# Patient Record
Sex: Female | Born: 1950 | Race: White | Hispanic: No | Smoking: Never smoker
Health system: Southern US, Community
[De-identification: ages and names within clinical notes are randomized; demographics above are authoritative.]

## PROBLEM LIST (undated history)

## (undated) DIAGNOSIS — M1990 Unspecified osteoarthritis, unspecified site: Secondary | ICD-10-CM

## (undated) DIAGNOSIS — G8929 Other chronic pain: Secondary | ICD-10-CM

## (undated) DIAGNOSIS — R51 Headache: Secondary | ICD-10-CM

## (undated) DIAGNOSIS — R05 Cough: Secondary | ICD-10-CM

## (undated) DIAGNOSIS — I10 Essential (primary) hypertension: Secondary | ICD-10-CM

## (undated) DIAGNOSIS — I340 Nonrheumatic mitral (valve) insufficiency: Secondary | ICD-10-CM

## (undated) DIAGNOSIS — T8859XA Other complications of anesthesia, initial encounter: Secondary | ICD-10-CM

## (undated) DIAGNOSIS — M797 Fibromyalgia: Secondary | ICD-10-CM

## (undated) DIAGNOSIS — I429 Cardiomyopathy, unspecified: Secondary | ICD-10-CM

## (undated) DIAGNOSIS — R059 Cough, unspecified: Secondary | ICD-10-CM

## (undated) DIAGNOSIS — IMO0002 Reserved for concepts with insufficient information to code with codable children: Secondary | ICD-10-CM

## (undated) DIAGNOSIS — E507 Other ocular manifestations of vitamin A deficiency: Secondary | ICD-10-CM

## (undated) DIAGNOSIS — I498 Other specified cardiac arrhythmias: Secondary | ICD-10-CM

## (undated) DIAGNOSIS — Z95810 Presence of automatic (implantable) cardiac defibrillator: Secondary | ICD-10-CM

## (undated) DIAGNOSIS — Z950 Presence of cardiac pacemaker: Secondary | ICD-10-CM

## (undated) DIAGNOSIS — R011 Cardiac murmur, unspecified: Secondary | ICD-10-CM

## (undated) DIAGNOSIS — M549 Dorsalgia, unspecified: Secondary | ICD-10-CM

## (undated) DIAGNOSIS — Z8719 Personal history of other diseases of the digestive system: Secondary | ICD-10-CM

## (undated) DIAGNOSIS — T4145XA Adverse effect of unspecified anesthetic, initial encounter: Secondary | ICD-10-CM

## (undated) DIAGNOSIS — I509 Heart failure, unspecified: Secondary | ICD-10-CM

---
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
## 1991-04-02 HISTORY — PX: DILATION AND CURETTAGE OF UTERUS: SHX78

## 1994-04-01 HISTORY — PX: POSTERIOR CERVICAL LAMINECTOMY: SHX2248

## 1998-04-01 HISTORY — PX: ANTERIOR CERVICAL DECOMP/DISCECTOMY FUSION: SHX1161

## 2008-04-01 HISTORY — PX: CARDIAC DEFIBRILLATOR PLACEMENT: SHX171

## 2008-07-30 HISTORY — PX: CARDIAC CATHETERIZATION: SHX172

## 2012-09-17 ENCOUNTER — Other Ambulatory Visit: Payer: Self-pay | Admitting: Orthopedic Surgery

## 2012-09-17 ENCOUNTER — Encounter (HOSPITAL_COMMUNITY): Payer: Self-pay | Admitting: Respiratory Therapy

## 2012-09-21 ENCOUNTER — Encounter (HOSPITAL_COMMUNITY)
Admission: RE | Admit: 2012-09-21 | Discharge: 2012-09-21 | Disposition: A | Discharge status: Home / Self Care | Payer: BC Managed Care – PPO | Source: Ambulatory Visit | Attending: Orthopedic Surgery | Admitting: Orthopedic Surgery

## 2012-09-21 ENCOUNTER — Encounter (HOSPITAL_COMMUNITY)
Admission: RE | Admit: 2012-09-21 | Discharge: 2012-09-21 | Disposition: A | Discharge status: Home / Self Care | Payer: BC Managed Care – PPO | Source: Ambulatory Visit | Attending: Anesthesiology | Admitting: Anesthesiology

## 2012-09-21 ENCOUNTER — Encounter (HOSPITAL_COMMUNITY): Payer: Self-pay

## 2012-09-21 HISTORY — DX: Cardiomyopathy, unspecified: I42.9

## 2012-09-21 HISTORY — DX: Unspecified osteoarthritis, unspecified site: M19.90

## 2012-09-21 HISTORY — DX: Presence of automatic (implantable) cardiac defibrillator: Z95.810

## 2012-09-21 HISTORY — DX: Headache: R51

## 2012-09-21 HISTORY — DX: Presence of cardiac pacemaker: Z95.0

## 2012-09-21 HISTORY — DX: Heart failure, unspecified: I50.9

## 2012-09-21 HISTORY — DX: Fibromyalgia: M79.7

## 2012-09-21 HISTORY — DX: Cough, unspecified: R05.9

## 2012-09-21 HISTORY — DX: Nonrheumatic mitral (valve) insufficiency: I34.0

## 2012-09-21 HISTORY — DX: Cardiac murmur, unspecified: R01.1

## 2012-09-21 HISTORY — DX: Cough: R05

## 2012-09-21 HISTORY — DX: Other ocular manifestations of vitamin A deficiency: E50.7

## 2012-09-21 LAB — CBC
Hemoglobin: 14.1 g/dL (ref 12.0–15.0)
MCH: 31.4 pg (ref 26.0–34.0)
MCHC: 33.9 g/dL (ref 30.0–36.0)
MCV: 92.7 fL (ref 78.0–100.0)
RBC: 4.49 MIL/uL (ref 3.87–5.11)

## 2012-09-21 LAB — BASIC METABOLIC PANEL
BUN: 13 mg/dL (ref 6–23)
CO2: 31 mEq/L (ref 19–32)
Calcium: 9.5 mg/dL (ref 8.4–10.5)
GFR calc non Af Amer: 74 mL/min — ABNORMAL LOW (ref >90)
Glucose, Bld: 94 mg/dL (ref 70–99)

## 2012-09-21 MED ORDER — CLINDAMYCIN PHOSPHATE 900 MG/50ML IV SOLN
900.0000 mg | INTRAVENOUS | Status: AC
  Administered 2012-09-22: 900 mg via INTRAVENOUS
  Filled 2012-09-21: qty 50

## 2012-09-21 NOTE — Progress Notes (Signed)
Pt denies SOB and chest pain. Pt states that Dr. Malissa Hippo of [facility] is her cardiologist. EKG, chest x ray and cardiac studies ( stress, echo, and cardiac cath records)  were requested. Also Peri-op prescription for ICD was sent to [facility] attn: Dr. Lorrin Mais . Per Dr. Morley Kos ( anesthesia) request, Kerry Fort of [employer] was notified to turn off ICD in holding area for pt shoulder procedure. Dr. Noreene Larsson (anesthesia) made aware that Ar[facility] John stated that " a magnet can be used and the PACU has a Engineer, materials for post-surgery interrogation." Dr. Noreene Larsson made aware and will follow up with Kerry Fort.

## 2012-09-21 NOTE — Pre-Procedure Instructions (Addendum)
VERSIA MIGNOGNA  09/21/2012   Your procedure is scheduled on: Tuesday, September 22, 2012  Report to [facility] at 10:30 AM.  Call this number if you have problems the morning of surgery: [phone]   Remember:   Do not eat food or drink liquids after midnight.   Take these medicines the morning of surgery with A SIP OF WATER:  nebivolol (BYSTOLIC) 2.5 MG tablet,  traMADol (ULTRAM-ER) 100 MG 24 hr tablet,  fluticasone (FLONASE) 50 MCG/ACT nasal spray, if needed: hydroxypropyl methylcellulose (ISOPTO TEARS) 2.5 % ophthalmic solution Stop taking Aspirin and herbal medications. Do not take any NSAIDs ie: Ibuprofen, Advil, Naproxen or any medication containing Aspirin.  Do not wear jewelry, make-up or nail polish.  Do not wear lotions, powders, or perfumes. You may wear deodorant.  Do not shave 48 hours prior to surgery. Men may shave face and neck.  Do not bring valuables to the hospital.  [facility] is not responsible for any belongings or valuables.  Contacts, dentures or bridgework may not be worn into surgery.  Leave suitcase in the car. After surgery it may be brought to your room.  For patients admitted to the hospital, checkout time is 11:00 AM the day of discharge.   Patients discharged the day of surgery will not be allowed to drive home.  Name and phone number of your driver:   Special Instructions: Shower using CHG 2 nights before surgery and the night before surgery.  If you shower the day of surgery use CHG.  Use special wash - you have one bottle of CHG for all showers.  You should use approximately 1/3 of the bottle for each shower.   Please read over the following fact sheets that you were given: Pain Booklet, Coughing and Deep Breathing, MRSA Information and Surgical Site Infection Prevention

## 2012-09-22 ENCOUNTER — Ambulatory Visit (HOSPITAL_COMMUNITY): Payer: BC Managed Care – PPO | Admitting: Anesthesiology

## 2012-09-22 ENCOUNTER — Encounter (HOSPITAL_COMMUNITY): Payer: Self-pay | Admitting: Surgery

## 2012-09-22 ENCOUNTER — Encounter (HOSPITAL_COMMUNITY): Payer: Self-pay | Admitting: Anesthesiology

## 2012-09-22 ENCOUNTER — Encounter (HOSPITAL_COMMUNITY)
Admission: RE | Disposition: A | Discharge status: Home / Self Care | Payer: Self-pay | Source: Ambulatory Visit | Attending: Orthopedic Surgery

## 2012-09-22 ENCOUNTER — Observation Stay (HOSPITAL_COMMUNITY)
Admission: RE | Admit: 2012-09-22 | Discharge: 2012-09-23 | Disposition: A | Discharge status: Home / Self Care | Payer: BC Managed Care – PPO | Source: Ambulatory Visit | Attending: Orthopedic Surgery | Admitting: Orthopedic Surgery

## 2012-09-22 DIAGNOSIS — M719 Bursopathy, unspecified: Secondary | ICD-10-CM | POA: Insufficient documentation

## 2012-09-22 DIAGNOSIS — Z95810 Presence of automatic (implantable) cardiac defibrillator: Secondary | ICD-10-CM | POA: Insufficient documentation

## 2012-09-22 DIAGNOSIS — M67919 Unspecified disorder of synovium and tendon, unspecified shoulder: Secondary | ICD-10-CM | POA: Insufficient documentation

## 2012-09-22 DIAGNOSIS — I428 Other cardiomyopathies: Secondary | ICD-10-CM | POA: Insufficient documentation

## 2012-09-22 DIAGNOSIS — I059 Rheumatic mitral valve disease, unspecified: Secondary | ICD-10-CM | POA: Insufficient documentation

## 2012-09-22 DIAGNOSIS — M7500 Adhesive capsulitis of unspecified shoulder: Principal | ICD-10-CM | POA: Insufficient documentation

## 2012-09-22 DIAGNOSIS — I509 Heart failure, unspecified: Secondary | ICD-10-CM | POA: Insufficient documentation

## 2012-09-22 DIAGNOSIS — M7502 Adhesive capsulitis of left shoulder: Secondary | ICD-10-CM

## 2012-09-22 HISTORY — DX: Other chronic pain: G89.29

## 2012-09-22 HISTORY — PX: SHOULDER ARTHROSCOPY WITH DEBRIDEMENT AND BICEP TENDON REPAIR: SHX5690

## 2012-09-22 HISTORY — DX: Personal history of other diseases of the digestive system: Z87.19

## 2012-09-22 HISTORY — DX: Dorsalgia, unspecified: M54.9

## 2012-09-22 HISTORY — DX: Reserved for concepts with insufficient information to code with codable children: IMO0002

## 2012-09-22 HISTORY — DX: Unspecified osteoarthritis, unspecified site: M19.90

## 2012-09-22 HISTORY — DX: Essential (primary) hypertension: I10

## 2012-09-22 SURGERY — SHOULDER ARTHROSCOPY WITH DEBRIDEMENT AND BICEP TENDON REPAIR
Anesthesia: General | Site: Shoulder | Laterality: Left | Wound class: Clean

## 2012-09-22 MED ORDER — CHLORHEXIDINE GLUCONATE 4 % EX LIQD: 60.0000 mL | Freq: Once | CUTANEOUS | Status: DC

## 2012-09-22 MED ORDER — MIDAZOLAM HCL 2 MG/2ML IJ SOLN: 1.0000 mg | INTRAMUSCULAR | Status: DC | PRN

## 2012-09-22 MED ORDER — LEVOCARNITINE 1 GM/10ML PO SOLN
500.0000 mg | Freq: Every day | ORAL | Status: DC
  Administered 2012-09-22: 500 mg via ORAL
  Filled 2012-09-22 (×2): qty 5

## 2012-09-22 MED ORDER — PHENOL 1.4 % MT LIQD: 1.0000 | OROMUCOSAL | Status: DC | PRN

## 2012-09-22 MED ORDER — NEOSTIGMINE METHYLSULFATE 1 MG/ML IJ SOLN
INTRAMUSCULAR | Status: DC | PRN
  Administered 2012-09-22: 2 mg via INTRAVENOUS

## 2012-09-22 MED ORDER — PROPOFOL 10 MG/ML IV BOLUS
INTRAVENOUS | Status: DC | PRN
  Administered 2012-09-22: 100 mg via INTRAVENOUS
  Administered 2012-09-22: 60 mg via INTRAVENOUS

## 2012-09-22 MED ORDER — SODIUM CHLORIDE 0.9 % IR SOLN
Status: DC | PRN
  Administered 2012-09-22 (×2): 3000 mL

## 2012-09-22 MED ORDER — MAGNESIUM OXIDE 400 MG PO TABS: 400.0000 mg | ORAL_TABLET | Freq: Every day | ORAL | Status: DC

## 2012-09-22 MED ORDER — EPINEPHRINE HCL 1 MG/ML IJ SOLN
INTRAMUSCULAR | Status: AC
  Filled 2012-09-22: qty 1

## 2012-09-22 MED ORDER — ACETAMINOPHEN 325 MG PO TABS: 650.0000 mg | ORAL_TABLET | Freq: Four times a day (QID) | ORAL | Status: DC | PRN

## 2012-09-22 MED ORDER — FENTANYL CITRATE 0.05 MG/ML IJ SOLN: 50.0000 ug | INTRAMUSCULAR | Status: DC | PRN

## 2012-09-22 MED ORDER — KETOROLAC TROMETHAMINE 15 MG/ML IJ SOLN
15.0000 mg | Freq: Once | INTRAMUSCULAR | Status: DC
  Filled 2012-09-22: qty 1

## 2012-09-22 MED ORDER — LIDOCAINE HCL (CARDIAC) 20 MG/ML IV SOLN
INTRAVENOUS | Status: DC | PRN
  Administered 2012-09-22: 80 mg via INTRAVENOUS

## 2012-09-22 MED ORDER — LEVOCARNITINE 500 MG PO TABS
500.0000 mg | ORAL_TABLET | Freq: Every day | ORAL | Status: DC
Start: 2012-09-22 — End: 2012-09-22

## 2012-09-22 MED ORDER — NEBIVOLOL HCL 2.5 MG PO TABS
2.5000 mg | ORAL_TABLET | Freq: Every day | ORAL | Status: DC
  Administered 2012-09-22 – 2012-09-23 (×2): 2.5 mg via ORAL
  Filled 2012-09-22 (×2): qty 1

## 2012-09-22 MED ORDER — LACTATED RINGERS IV SOLN
INTRAVENOUS | Status: DC | PRN
  Administered 2012-09-22: 12:00:00 via INTRAVENOUS

## 2012-09-22 MED ORDER — METOCLOPRAMIDE HCL 5 MG PO TABS: 5.0000 mg | ORAL_TABLET | Freq: Three times a day (TID) | ORAL | Status: DC | PRN

## 2012-09-22 MED ORDER — METOCLOPRAMIDE HCL 5 MG/ML IJ SOLN: 5.0000 mg | Freq: Three times a day (TID) | INTRAMUSCULAR | Status: DC | PRN

## 2012-09-22 MED ORDER — HYDROMORPHONE HCL PF 1 MG/ML IJ SOLN: 0.5000 mg | INTRAMUSCULAR | Status: DC | PRN

## 2012-09-22 MED ORDER — METOPROLOL TARTRATE 1 MG/ML IV SOLN
INTRAVENOUS | Status: DC | PRN
  Administered 2012-09-22: .25 mg via INTRAVENOUS

## 2012-09-22 MED ORDER — ROCURONIUM BROMIDE 100 MG/10ML IV SOLN
INTRAVENOUS | Status: DC | PRN
  Administered 2012-09-22: 35 mg via INTRAVENOUS

## 2012-09-22 MED ORDER — EPHEDRINE SULFATE 50 MG/ML IJ SOLN
INTRAMUSCULAR | Status: DC | PRN
  Administered 2012-09-22: 10 mg via INTRAVENOUS
  Administered 2012-09-22: 5 mg via INTRAVENOUS

## 2012-09-22 MED ORDER — SODIUM CHLORIDE 0.9 % IJ SOLN
INTRAMUSCULAR | Status: DC | PRN
  Administered 2012-09-22: 50 mL

## 2012-09-22 MED ORDER — MENTHOL 3 MG MT LOZG: 1.0000 | LOZENGE | OROMUCOSAL | Status: DC | PRN

## 2012-09-22 MED ORDER — SPIRONOLACTONE 12.5 MG HALF TABLET
12.5000 mg | ORAL_TABLET | Freq: Every day | ORAL | Status: DC
Start: 2012-09-22 — End: 2012-09-23
  Administered 2012-09-22 – 2012-09-23 (×2): 12.5 mg via ORAL
  Filled 2012-09-22 (×2): qty 1

## 2012-09-22 MED ORDER — KETOROLAC TROMETHAMINE 15 MG/ML IJ SOLN
15.0000 mg | Freq: Four times a day (QID) | INTRAMUSCULAR | Status: DC
  Administered 2012-09-22 – 2012-09-23 (×3): 15 mg via INTRAVENOUS
  Filled 2012-09-22 (×6): qty 1

## 2012-09-22 MED ORDER — ASPIRIN 325 MG PO TABS
325.0000 mg | ORAL_TABLET | Freq: Every day | ORAL | Status: DC
  Filled 2012-09-22: qty 1

## 2012-09-22 MED ORDER — EPINEPHRINE HCL 1 MG/ML IJ SOLN
INTRAMUSCULAR | Status: DC | PRN
  Administered 2012-09-22: 1 mg

## 2012-09-22 MED ORDER — RAMIPRIL 1.25 MG PO CAPS
1.2500 mg | ORAL_CAPSULE | Freq: Every day | ORAL | Status: DC
  Administered 2012-09-22: 1.25 mg via ORAL
  Filled 2012-09-22 (×3): qty 1

## 2012-09-22 MED ORDER — MIDAZOLAM HCL 2 MG/2ML IJ SOLN
INTRAMUSCULAR | Status: AC
  Administered 2012-09-22: 1 mg via INTRAVENOUS
  Filled 2012-09-22: qty 2

## 2012-09-22 MED ORDER — GLYCOPYRROLATE 0.2 MG/ML IJ SOLN
INTRAMUSCULAR | Status: DC | PRN
  Administered 2012-09-22: 0.4 mg via INTRAVENOUS

## 2012-09-22 MED ORDER — HYPROMELLOSE (GONIOSCOPIC) 2.5 % OP SOLN
1.0000 [drp] | Freq: Four times a day (QID) | OPHTHALMIC | Status: DC | PRN
  Filled 2012-09-22: qty 15

## 2012-09-22 MED ORDER — ONDANSETRON HCL 4 MG PO TABS: 4.0000 mg | ORAL_TABLET | Freq: Four times a day (QID) | ORAL | Status: DC | PRN

## 2012-09-22 MED ORDER — FENTANYL CITRATE 0.05 MG/ML IJ SOLN
INTRAMUSCULAR | Status: AC
  Administered 2012-09-22: 50 ug via INTRAVENOUS
  Filled 2012-09-22: qty 2

## 2012-09-22 MED ORDER — FLUTICASONE PROPIONATE 50 MCG/ACT NA SUSP
2.0000 | Freq: Every day | NASAL | Status: DC
  Filled 2012-09-22: qty 16

## 2012-09-22 MED ORDER — CLINDAMYCIN PHOSPHATE 600 MG/50ML IV SOLN
600.0000 mg | Freq: Four times a day (QID) | INTRAVENOUS | Status: AC
  Administered 2012-09-22 – 2012-09-23 (×3): 600 mg via INTRAVENOUS
  Filled 2012-09-22 (×3): qty 50

## 2012-09-22 MED ORDER — TRAMADOL HCL 50 MG PO TABS
50.0000 mg | ORAL_TABLET | Freq: Four times a day (QID) | ORAL | Status: DC
  Filled 2012-09-22 (×4): qty 1

## 2012-09-22 MED ORDER — DIGOXIN 125 MCG PO TABS
0.1250 mg | ORAL_TABLET | Freq: Every day | ORAL | Status: DC
  Administered 2012-09-22: 0.125 mg via ORAL
  Filled 2012-09-22 (×3): qty 1

## 2012-09-22 MED ORDER — ACETAMINOPHEN 650 MG RE SUPP: 650.0000 mg | Freq: Four times a day (QID) | RECTAL | Status: DC | PRN

## 2012-09-22 MED ORDER — ONDANSETRON HCL 4 MG/2ML IJ SOLN: 4.0000 mg | Freq: Four times a day (QID) | INTRAMUSCULAR | Status: DC | PRN

## 2012-09-22 MED ORDER — MAGNESIUM OXIDE 400 (241.3 MG) MG PO TABS
400.0000 mg | ORAL_TABLET | Freq: Every day | ORAL | Status: DC
  Administered 2012-09-22 – 2012-09-23 (×2): 400 mg via ORAL
  Filled 2012-09-22 (×2): qty 1

## 2012-09-22 MED ORDER — ROPIVACAINE HCL 5 MG/ML IJ SOLN
INTRAMUSCULAR | Status: DC | PRN
  Administered 2012-09-22: 20 mL

## 2012-09-22 MED ORDER — KETOROLAC TROMETHAMINE 30 MG/ML IJ SOLN
INTRAMUSCULAR | Status: AC
  Administered 2012-09-22: 15 mg
  Filled 2012-09-22: qty 1

## 2012-09-22 MED ORDER — FUROSEMIDE 20 MG PO TABS: 20.0000 mg | ORAL_TABLET | Freq: Every day | ORAL | Status: DC | PRN

## 2012-09-22 MED ORDER — ARTIFICIAL TEARS OP OINT
TOPICAL_OINTMENT | OPHTHALMIC | Status: DC | PRN
  Administered 2012-09-22: 1 via OPHTHALMIC

## 2012-09-22 MED ORDER — POLYVINYL ALCOHOL 1.4 % OP SOLN
1.0000 [drp] | Freq: Four times a day (QID) | OPHTHALMIC | Status: DC | PRN
  Filled 2012-09-22 (×2): qty 15

## 2012-09-22 MED ORDER — ONDANSETRON HCL 4 MG/2ML IJ SOLN
INTRAMUSCULAR | Status: DC | PRN
  Administered 2012-09-22: 4 mg via INTRAVENOUS

## 2012-09-22 SURGICAL SUPPLY — 66 items
BENZOIN TINCTURE PRP APPL 2/3 (GAUZE/BANDAGES/DRESSINGS) IMPLANT
BIT DRILL TAK (DRILL) IMPLANT
BLADE CUDA 5.5 (BLADE) IMPLANT
BLADE CUTTER GATOR 3.5 (BLADE) IMPLANT
BLADE GREAT WHITE 4.2 (BLADE) ×2 IMPLANT
BLADE SURG 11 STRL SS (BLADE) ×2 IMPLANT
BUR GATOR 2.9 (BURR) IMPLANT
BUR OVAL 6.0 (BURR) IMPLANT
CANNULA SHOULDER 7CM (CANNULA) IMPLANT
CARTRIDGE CURVETEK MED (MISCELLANEOUS) IMPLANT
CARTRIDGE CURVETEK XLRG (MISCELLANEOUS) IMPLANT
CLOTH BEACON ORANGE TIMEOUT ST (SAFETY) ×2 IMPLANT
COVER SURGICAL LIGHT HANDLE (MISCELLANEOUS) ×2 IMPLANT
DRAPE INCISE IOBAN 66X45 STRL (DRAPES) ×4 IMPLANT
DRAPE STERI 35X30 U-POUCH (DRAPES) ×2 IMPLANT
DRAPE U-SHAPE 47X51 STRL (DRAPES) ×4 IMPLANT
DRILL TAK (DRILL)
DRSG PAD ABDOMINAL 8X10 ST (GAUZE/BANDAGES/DRESSINGS) ×2 IMPLANT
DURAPREP 26ML APPLICATOR (WOUND CARE) ×4 IMPLANT
ELECT MENISCUS 165MM 90D (ELECTRODE) IMPLANT
ELECT REM PT RETURN 9FT ADLT (ELECTROSURGICAL) ×2
ELECTRODE REM PT RTRN 9FT ADLT (ELECTROSURGICAL) ×1 IMPLANT
FILTER STRAW FLUID ASPIR (MISCELLANEOUS) ×2 IMPLANT
GAUZE XEROFORM 1X8 LF (GAUZE/BANDAGES/DRESSINGS) IMPLANT
GLOVE BIO SURGEON ST LM GN SZ9 (GLOVE) IMPLANT
GLOVE BIOGEL PI IND STRL 8 (GLOVE) ×1 IMPLANT
GLOVE BIOGEL PI INDICATOR 8 (GLOVE) ×1
GLOVE SURG ORTHO 8.0 STRL STRW (GLOVE) IMPLANT
GOWN PREVENTION PLUS LG XLONG (DISPOSABLE) IMPLANT
GOWN STRL NON-REIN LRG LVL3 (GOWN DISPOSABLE) ×6 IMPLANT
KIT BASIN OR (CUSTOM PROCEDURE TRAY) ×2 IMPLANT
KIT ROOM TURNOVER OR (KITS) ×2 IMPLANT
MANIFOLD NEPTUNE II (INSTRUMENTS) ×2 IMPLANT
NDL SUT 6 .5 CRC .975X.05 MAYO (NEEDLE) IMPLANT
NEEDLE HYPO 25X1 1.5 SAFETY (NEEDLE) ×2 IMPLANT
NEEDLE MAYO TAPER (NEEDLE)
NEEDLE SPNL 18GX3.5 QUINCKE PK (NEEDLE) ×2 IMPLANT
NS IRRIG 1000ML POUR BTL (IV SOLUTION) ×2 IMPLANT
PACK SHOULDER (CUSTOM PROCEDURE TRAY) ×2 IMPLANT
PAD ARMBOARD 7.5X6 YLW CONV (MISCELLANEOUS) ×4 IMPLANT
SET ARTHROSCOPY TUBING (MISCELLANEOUS) ×1
SET ARTHROSCOPY TUBING LN (MISCELLANEOUS) ×1 IMPLANT
SLING ARM IMMOBILIZER MED (SOFTGOODS) ×2 IMPLANT
SPEAR FASTAKII (SLEEVE) IMPLANT
SPONGE GAUZE 4X4 12PLY (GAUZE/BANDAGES/DRESSINGS) IMPLANT
SPONGE LAP 4X18 X RAY DECT (DISPOSABLE) ×4 IMPLANT
STRIP CLOSURE SKIN 1/2X4 (GAUZE/BANDAGES/DRESSINGS) IMPLANT
SUCTION FRAZIER TIP 10 FR DISP (SUCTIONS) ×2 IMPLANT
SUT ETHILON 3 0 PS 1 (SUTURE) ×4 IMPLANT
SUT FIBERWIRE 2-0 18 17.9 3/8 (SUTURE)
SUT PROLENE 3 0 PS 2 (SUTURE) IMPLANT
SUT VIC AB 0 CT1 27 (SUTURE)
SUT VIC AB 0 CT1 27XBRD ANBCTR (SUTURE) IMPLANT
SUT VIC AB 1 CT1 27 (SUTURE)
SUT VIC AB 1 CT1 27XBRD ANBCTR (SUTURE) IMPLANT
SUT VIC AB 2-0 CT1 27 (SUTURE)
SUT VIC AB 2-0 CT1 TAPERPNT 27 (SUTURE) IMPLANT
SUT VICRYL 0 UR6 27IN ABS (SUTURE) IMPLANT
SUTURE FIBERWR 2-0 18 17.9 3/8 (SUTURE) IMPLANT
SYR 20CC LL (SYRINGE) ×4 IMPLANT
SYR 3ML LL SCALE MARK (SYRINGE) ×2 IMPLANT
SYR TB 1ML LUER SLIP (SYRINGE) ×2 IMPLANT
TOWEL OR 17X24 6PK STRL BLUE (TOWEL DISPOSABLE) ×2 IMPLANT
TOWEL OR 17X26 10 PK STRL BLUE (TOWEL DISPOSABLE) ×2 IMPLANT
WAND 90 DEG TURBOVAC W/CORD (SURGICAL WAND) ×2 IMPLANT
WATER STERILE IRR 1000ML POUR (IV SOLUTION) ×2 IMPLANT

## 2012-09-22 NOTE — Transfer of Care (Signed)
Immediate Anesthesia Transfer of Care Note  Patient: Leah Gomez  Procedure(s) Performed: Procedure(s) with comments: SHOULDER ARTHROSCOPY WITH DEBRIDEMENT AND BICEP TENDON REPAIR (Left) - Arthroscopy Shoulder with manipulation under anesthesia  Patient Location: PACU  Anesthesia Type:General  Level of Consciousness: awake, alert  and oriented  Airway & Oxygen Therapy: Patient Spontanous Breathing and Patient connected to nasal cannula oxygen  Post-op Assessment: Report given to PACU RN  Post vital signs: Reviewed and stable  Complications: No apparent anesthesia complications

## 2012-09-22 NOTE — Progress Notes (Signed)
Pacemaker interrogated by [employer] employee Lamar Sprinkles.

## 2012-09-22 NOTE — Progress Notes (Signed)
Perioperative prescription for implanted cardiac device programming sheet refaxed to Dr. Lorrin Mais at [facility].

## 2012-09-22 NOTE — Anesthesia Postprocedure Evaluation (Signed)
  Anesthesia Post-op Note  Patient: Leah Gomez  Procedure(s) Performed: Procedure(s) with comments: SHOULDER ARTHROSCOPY WITH DEBRIDEMENT AND BICEP TENDON REPAIR (Left) - Arthroscopy Shoulder with manipulation under anesthesia  Patient Location: PACU  Anesthesia Type:GA combined with regional for post-op pain  Level of Consciousness: awake, alert  and oriented  Airway and Oxygen Therapy: Patient Spontanous Breathing and Patient connected to nasal cannula oxygen  Post-op Pain: none  Post-op Assessment: Post-op Vital signs reviewed, Patient's Cardiovascular Status Stable, Respiratory Function Stable, Patent Airway and No signs of Nausea or vomiting  Post-op Vital Signs: Reviewed and stable  Complications: No apparent anesthesia complications

## 2012-09-22 NOTE — Brief Op Note (Signed)
09/22/2012  2:39 PM  PATIENT:  Rich Fuchs  62 y.o. female  PRE-OPERATIVE DIAGNOSIS:  Left Shoulder Adhesive Capsulitis  POST-OPERATIVE DIAGNOSIS:  Left Shoulder Adhesive Capsulitis  PROCEDURE:  Procedure(s): SHOULDER ARTHROSCOPY WITH DEBRIDEMENT AND manipulation  SURGEON:  Surgeon(s): Cammy Copa, MD  ASSISTANT:   ANESTHESIA:   general  EBL: 10 ml       BLOOD ADMINISTERED: none  DRAINS: none   LOCAL MEDICATIONS USED:  none  SPECIMEN:  No Specimen  COUNTS:  YES  TOURNIQUET:  * No tourniquets in log *  DICTATION: .Other Dictation: Dictation Number [id number]  PLAN OF CARE: Admit for overnight observation  PATIENT DISPOSITION:  PACU - hemodynamically stable

## 2012-09-22 NOTE — Preoperative (Signed)
Beta Blockers   Reason not to administer Beta Blockers:Not Applicable, last dose 09/21/12 at 12:30pm

## 2012-09-22 NOTE — Anesthesia Preprocedure Evaluation (Signed)
Anesthesia Evaluation  Patient identified by MRN, date of birth, ID band Patient awake    Airway Mallampati: I      Dental  (+) Teeth Intact   Pulmonary  breath sounds clear to auscultation        Cardiovascular +CHF + pacemaker + Cardiac Defibrillator + Valvular Problems/Murmurs Rhythm:Regular Rate:Tachycardia     Neuro/Psych  Headaches,  Neuromuscular disease    GI/Hepatic   Endo/Other    Renal/GU      Musculoskeletal  (+) Arthritis -, Fibromyalgia -  Abdominal   Peds  Hematology   Anesthesia Other Findings   Reproductive/Obstetrics                           Anesthesia Physical Anesthesia Plan  ASA: III  Anesthesia Plan:    Post-op Pain Management:    Induction: Intravenous  Airway Management Planned: Oral ETT  Additional Equipment:   Intra-op Plan:   Post-operative Plan: Extubation in OR  Informed Consent: I have reviewed the patients History and Physical, chart, labs and discussed the procedure including the risks, benefits and alternatives for the proposed anesthesia with the patient or authorized representative who has indicated his/her understanding and acceptance.   Dental advisory given  Plan Discussed with: CRNA and Surgeon  Anesthesia Plan Comments: (Discussed plan c patient in detail , AICD, and plan for block fof shoulder, )        Anesthesia Quick Evaluation

## 2012-09-22 NOTE — H&P (Signed)
Leah Gomez is an 62 y.o. female.   Chief Complaint: Left shoulder pain and stiffness HPI: Leah Gomez is a 62 year old patient with left shoulder pain and stiffness of 6 months duration. Pain and stiffness began insidiously after minor trauma. She has had treatment to date involving physical therapy injection of which did not give her any pain relief. She reports pain with ADLs progressive stiffness which is worsening as well as a pain which he resting at night. Patient does have multiple other medical problems with significant which included a pacemaker for heart failure. Her scan showed ejection fraction of 25%. The patient does like to swim but she's not been able to swim in about 6 weeks am which was Sunday she going to a limited pressure because of his shoulder stiffness. She has had neck fusion as well but denies any radicular type symptoms.  Past Medical History  Diagnosis Date  . Cardiomyopathy     Hx: of nonischemic  . CHF (congestive heart failure)   . Automatic implantable cardioverter-defibrillator in situ   . Pacemaker   . DJD (degenerative joint disease)     Hx: of  . Xerophthalmia     Hx: of  . Chronic pain     Hx; of  . Mitral regurgitation     Hx: of  . Cough     Hx: of  . Heart murmur     Hx: of  . Headache(784.0)     Hx: of "from neck pain"  . Fibromyalgia     Past Surgical History  Procedure Laterality Date  . Laminectomy      Hx: of  . Cervical fusion      Hx: of  . Insert / replace / remove pacemaker    . Cardiac catheterization      Hx: of may, 2010  . Dilation and curettage of uterus      Family History  Problem Relation Age of Onset  . Hypertension Mother   . Hypothyroidism Mother    Social History:  reports that she has never smoked. She has never used smokeless tobacco. She reports that  drinks alcohol. She reports that she does not use illicit drugs.  Allergies:  Allergies  Allergen Reactions  . Codeine Other (See Comments)    GI  problems  . Diazepam Other (See Comments)    Bladder problems  . Levofloxacin   . Lisinopril Other (See Comments)    Cough and light headed  . Thimerosal Other (See Comments)    Redness   . Keflex (Cephalexin) Rash  . Latex Rash    Medications Prior to Admission  Medication Sig Dispense Refill  . BLACK CURRANT SEED OIL PO Take 1,000 mg by mouth daily.      . diclofenac (FLECTOR) 1.3 % PTCH Place 1 patch onto the skin 2 (two) times daily.      . digoxin (LANOXIN) 0.125 MG tablet Take 0.125 mg by mouth daily.      . fluticasone (FLONASE) 50 MCG/ACT nasal spray Place 2 sprays into the nose daily.      . furosemide (LASIX) 20 MG tablet Take 20 mg by mouth daily as needed (for swelling).      . hydroxypropyl methylcellulose (ISOPTO TEARS) 2.5 % ophthalmic solution Place 1 drop into both eyes 4 (four) times daily as needed.      Boris Lown Oil 300 MG CAPS Take 300 mg by mouth daily.      . Levocarnitine 500 MG TABS Take  500 mg by mouth daily.      . magnesium oxide (MAG-OX) 400 MG tablet Take 400 mg by mouth daily.      . nebivolol (BYSTOLIC) 2.5 MG tablet Take 2.5 mg by mouth daily.      . Nutritional Supplements (PYCNOGENOL PO) Take 50 mg by mouth daily.      Marland Kitchen OVER THE COUNTER MEDICATION Take 12 mg by mouth daily. BioAstin      . ramipril (ALTACE) 1.25 MG capsule Take 1.25 mg by mouth daily.      Marland Kitchen spironolactone (ALDACTONE) 25 MG tablet Take 12.5 mg by mouth daily.      . traMADol (ULTRAM-ER) 100 MG 24 hr tablet Take 100 mg by mouth daily.      . Turmeric 500 MG CAPS Take 500 mg by mouth daily.        Results for orders placed during the hospital encounter of 09/21/12 (from the past 48 hour(s))  SURGICAL PCR SCREEN     Status: None   Collection Time    09/21/12 11:49 AM      Result Value Range   MRSA, PCR NEGATIVE  NEGATIVE   Staphylococcus aureus NEGATIVE  NEGATIVE   Comment:            The Xpert SA Assay (FDA     approved for NASAL specimens     in patients over 21 years of  age),     is one component of     a comprehensive surveillance     program.  Test performance has     been validated by [employer] for patients greater     than or equal to 28 year old.     It is not intended     to diagnose infection nor to     guide or monitor treatment.  CBC     Status: None   Collection Time    09/21/12 11:50 AM      Result Value Range   WBC 10.0  4.0 - 10.5 K/uL   RBC 4.49  3.87 - 5.11 MIL/uL   Hemoglobin 14.1  12.0 - 15.0 g/dL   HCT 09.6  04.5 - 40.9 %   MCV 92.7  78.0 - 100.0 fL   MCH 31.4  26.0 - 34.0 pg   MCHC 33.9  30.0 - 36.0 g/dL   RDW 81.1  91.4 - 78.2 %   Platelets 184  150 - 400 K/uL  BASIC METABOLIC PANEL     Status: Abnormal   Collection Time    09/21/12 11:51 AM      Result Value Range   Sodium 137  135 - 145 mEq/L   Potassium 3.7  3.5 - 5.1 mEq/L   Chloride 98  96 - 112 mEq/L   CO2 31  19 - 32 mEq/L   Glucose, Bld 94  70 - 99 mg/dL   BUN 13  6 - 23 mg/dL   Creatinine, Ser 9.56  0.50 - 1.10 mg/dL   Calcium 9.5  8.4 - 21.3 mg/dL   GFR calc non Af Amer 74 (*) >90 mL/min   GFR calc Af Amer 85 (*) >90 mL/min   Comment:            The eGFR has been calculated     using the CKD EPI equation.     This calculation has not been     validated in all clinical  situations.     eGFR's persistently     <90 mL/min signify     possible Chronic Kidney Disease.   Dg Chest 2 View  09/21/2012   *RADIOLOGY REPORT*  Clinical Data: Shoulder arthroscopy with debridement and biceps tendon repair.  Cardiomyopathy and history of CHF, mitral regurgitation.  CHEST - 2 VIEW  Comparison: None.  Findings: The patient has left-sided AICD / transvenous pacemaker. Leads overlie the right atrium, right ventricle, and coronary sinus.  Heart size is normal.  The lungs are clear.  No edema. Visualized osseous structures have a normal appearance.  IMPRESSION: No evidence for acute cardiopulmonary abnormality.   Original Report Authenticated By: Norva Pavlov, M.D.     Review of Systems  Constitutional: Negative.   HENT: Negative.   Eyes: Negative.   Respiratory: Negative.   Cardiovascular: Negative.   Gastrointestinal: Negative.   Genitourinary: Negative.   Musculoskeletal: Positive for joint pain.  Skin: Negative.   Neurological: Negative.   Endo/Heme/Allergies: Negative.   Psychiatric/Behavioral: Negative.     Blood pressure 125/57, pulse 70, temperature 97.6 F (36.4 C), temperature source Oral, resp. rate 20, SpO2 100.00%. Physical Exam  Constitutional: She appears well-developed.  HENT:  Head: Normocephalic.  Eyes: Pupils are equal, round, and reactive to light.  Neck: Normal range of motion.  Cardiovascular: Normal rate.   Respiratory: Effort normal.  Neurological: She is alert.  Skin: Skin is warm.  Psychiatric: She has a normal mood and affect.   left shoulder demonstrates reasonable superior/May the subscap strength but restricted range of motion 20 extra rotation 15 minutes abduction only about 40 of isolated minimal abduction and about 60 of isolette glenohumeral for collection no coarse grinding or crepitus noted with passive range of motion left shoulder neck range of motion full motor sensory function of the hand is intact radial pulses intact she did have an ultrasound of her left shoulder in the office which showed possible partial thickness rotator cuff tearing cannot have an MRI scan. Chest is some pain radiating into the biceps  Assessment/Plan Impression is left frozen shoulder which is been progressive worsening over the past 6 months she's had excellent nonoperative therapy including injection and formal physical therapy. She's not made any gains in range of motion. I discussed with her at length in the office about the natural history of present shoulder. This may take up to 2 to appears to resolve. Patient is awake that long. She's having daily symptoms and nightly pain. Patient does have a history of osteopenia.  Plan this time is for shoulder manipulation under anesthesia with arthroscopic evaluation and debridement. Heart scopic evaluation is primarily of the biceps tendon. There is any SLAP lesion present biceps tenodesis will be performed. Further release of the rotator interval could also be performed. But favor arthroscopic evaluation of the shoulder while the patient is under a just because of the pacemaker and need to fully evaluate the intra-articular joint structures. Patient understands the risk and benefits of surgery including limited to progress her shoulder stiffness fracture and a persistent pain and prolonged recovery. Do plan to use CPM machine for least 2 weeks postop. Also plan to admit overnight. This is prerenal because of her pacemaker. All questions answered DEAN,GREGORY SCOTT 09/22/2012, 12:35 PM

## 2012-09-22 NOTE — Progress Notes (Signed)
[employer] called, are on the way, Per Engineering geologist

## 2012-09-22 NOTE — Anesthesia Procedure Notes (Addendum)
Anesthesia Regional Block:  Supraclavicular block  Pre-Anesthetic Checklist: ,, timeout performed, Correct Patient, Correct Site, Correct Laterality, Correct Procedure, Correct Position, site marked, Risks and benefits discussed, Surgical consent,  At surgeon's request and post-op pain management  Laterality: Left and Upper  Prep: chloraprep       Needles:   Needle Type: Echogenic Needle      Needle Gauge: 22 and 22 G    Additional Needles:  Procedures: ultrasound guided (picture in chart) and nerve stimulator Supraclavicular block Narrative:  Start time: 09/22/2012 12:25 PM End time: 09/22/2012 12:40 PM Injection made incrementally with aspirations every 5 mL.  Performed by: Personally  Anesthesiologist: T Massagee  Additional Notes: Block discussed in detail. Consent obtained. Tolerated well   Procedure Name: Intubation Date/Time: 09/22/2012 1:15 PM Performed by: Jefm Miles E Pre-anesthesia Checklist: Patient identified, Timeout performed, Emergency Drugs available, Suction available and Patient being monitored Patient Re-evaluated:Patient Re-evaluated prior to inductionOxygen Delivery Method: Circle system utilized Preoxygenation: Pre-oxygenation with 100% oxygen Intubation Type: IV induction Ventilation: Mask ventilation without difficulty Laryngoscope Size: Mac and 3 Grade View: Grade II Tube type: Oral Tube size: 7.0 mm Number of attempts: 3 Airway Equipment and Method: Stylet and Video-laryngoscopy Placement Confirmation: ETT inserted through vocal cords under direct vision,  breath sounds checked- equal and bilateral and positive ETCO2 Secured at: 21 cm Tube secured with: Tape Dental Injury: Teeth and Oropharynx as per pre-operative assessment  Difficulty Due To: Difficulty was unanticipated and Difficult Airway- due to anterior larynx Future Recommendations: Recommend- induction with short-acting agent, and alternative techniques readily  available Comments: Patient has very anterior larynx, Two attempts with Mac 3 blade and ETT slid into esophagus, one attempt by Dr. Jacklynn Bue blind, unsuccessful, Successful with

## 2012-09-22 NOTE — Op Note (Signed)
Leah Gomez, Leah Gomez NO.:  [id number]  MEDICAL RECORD NO.:  [id number]  LOCATION:  [id number]                        FACILITY:  [facility]  PHYSICIAN:  Burnard Bunting, M.D.    DATE OF BIRTH:  1950-09-30  DATE OF PROCEDURE: DATE OF DISCHARGE:                              OPERATIVE REPORT   PREOPERATIVE DIAGNOSIS:  Left frozen shoulder.  POSTOPERATIVE DIAGNOSIS:  Left frozen shoulder and bursitis.  PROCEDURE:  Left shoulder manipulation under anesthesia with arthroscopy, extensive debridement of rotator interval and subacromial decompression with CA ligament release, but no acromioplasty.  SURGEON:  Burnard Bunting, M.D.  ASSISTANT:  None.  ANESTHESIA:  General endotracheal.  ESTIMATED BLOOD LOSS:  10 mL.  INDICATIONS:  [name] is a 62 year old patient with 63-month history of left frozen shoulder, presents now for operative management after explanation of risks and benefits.  PROCEDURE IN DETAIL:  Patient was brought to operating room where general endotracheal anesthesia was induced.  Time-out was called.  The patient was noted to have preoperatively examination under anesthesia, range of motion, external rotation at __________degrees, abduction 20. __________ abduction was about 40, __________ forward flexion was about 60.  The patient was manipulated into full forward flexion minimizing the lengths of the fulcrum.  Full forward flexion was achieved with __________ soft tissue release.  Patient was then manipulated in the full forward abduction 120 degrees.  External rotation increased only there from 20 degrees to 40 degrees.  At this time, the patient was placed in a beach-chair position with the head in neutral position. Left arm and hand was prescrubbed with alcohol and Betadine, allowed to air dry, prepped with DuraPrep solution and draped in a sterile manner. Posterior portal was created 2 cm posterior and inferior to posterolateral margin of the acromion, 2  cm medial and inferior to the posterolateral margin of the acromion.  Anterior portal created under direct visualization.  Diagnostic arthroscopy was performed.  Patient's biceps tendon although inflamed was intact.  There was no SLAP lesion. Rotator cuff was intact.  Significant synovitis present within the capsule, rotator cuff and within the rotator interval.  Glenohumeral joint was intact with no arthritis.  Rotator interval release was then performed at this time with the ArthroCare wand, taken from the top of the subscap to the supraspinatus.  At this time, following rotator interval release, the patient's external rotation increased from 40 degrees to 60 degrees.  At this time, scope placed in subacromial space. Lateral portal created under direct visualization.  Diagnostic bursectomy performed.  Bursa was removed.  CA ligament released.  Rotator cuff intact from the bursal side as well.  At this time, the joint thoroughly irrigated. Instruments were removed.  Portals closed using 3-0 nylon.  Sling applied.  Plan for immediate range of motion exercises and a CPM machine.  Did get a good result with the manipulation.     Burnard Bunting, M.D.     GSD/MEDQ  D:  09/22/2012  T:  09/22/2012  Job:  [id number]

## 2012-09-23 ENCOUNTER — Encounter (HOSPITAL_COMMUNITY): Payer: Self-pay | Admitting: Orthopedic Surgery

## 2012-09-23 MED ORDER — HYDROCODONE-ACETAMINOPHEN 10-325 MG PO TABS
1.0000 | ORAL_TABLET | ORAL | Status: DC | PRN
  Administered 2012-09-23: 1 via ORAL
  Filled 2012-09-23: qty 1

## 2012-09-23 MED ORDER — METHOCARBAMOL 500 MG PO TABS: 500.0000 mg | ORAL_TABLET | Freq: Four times a day (QID) | ORAL | Status: DC

## 2012-09-23 MED ORDER — HYDROCODONE-ACETAMINOPHEN 10-325 MG PO TABS: 1.0000 | ORAL_TABLET | ORAL | Status: DC | PRN

## 2012-09-23 MED ORDER — DOCUSATE SODIUM 100 MG PO CAPS: 100.0000 mg | ORAL_CAPSULE | Freq: Two times a day (BID) | ORAL | Status: DC

## 2012-09-23 NOTE — Progress Notes (Signed)
Pt stable - pain controlled vss sens ok left hand Plan dc today after OT - needs mepilex dressing change

## 2012-09-23 NOTE — Evaluation (Signed)
Occupational Therapy Evaluation Patient Details Name: Leah Gomez MRN: [id number] DOB: 06-30-1950 Today's Date: 09/23/2012 Time: 1020-1051 OT Time Calculation (min): 31 min  OT Assessment / Plan / Recommendation Clinical Impression  Pt is doing well so far. Will discharge home alone and CPM to be delivered.  No DME recommendations and PT will continue per MD order at outpatient.  Educated on Pendulums, AAROM shoulder flexion to 90 degrees and external rotation to 30 degrees with arm at her side.  No further OT needs.    OT Assessment  Progress rehab of shoulder as ordered by MD at follow-up appointment    Follow Up Recommendations  No OT follow up       Equipment Recommendations  None recommended by OT          Precautions / Restrictions Precautions Precautions: Shoulder Type of Shoulder Precautions: ADL education, PROM exercises Shoulder Interventions: Shoulder sling/immobilizer;For comfort Restrictions Weight Bearing Restrictions: Yes LUE Weight Bearing: Non weight bearing   Pertinent Vitals/Pain No report of pain    ADL  Eating/Feeding: Simulated;Independent Where Assessed - Eating/Feeding: Edge of bed Grooming: Simulated;Modified independent Where Assessed - Grooming: Supported standing Upper Body Bathing: Simulated;Modified independent Where Assessed - Upper Body Bathing: Unsupported standing Lower Body Bathing: Simulated;Modified independent Where Assessed - Lower Body Bathing: Unsupported sit to stand Upper Body Dressing: Simulated;Modified independent Where Assessed - Upper Body Dressing: Unsupported sitting Lower Body Dressing: Simulated;Modified independent Where Assessed - Lower Body Dressing: Supported sit to Pharmacist, hospital: Performed;Independent Acupuncturist: Regular height toilet Toileting - Clothing Manipulation and Hygiene: Performed;Independent Where Assessed - Toileting Clothing Manipulation and Hygiene: Sit to stand from  3-in-1 or toilet Tub/Shower Transfer: Simulated;Independent Transfers/Ambulation Related to ADLs: Pt is currently independent with mobility. ADL Comments: Pt educated on AAROM exercises for shoulder flexion, external rotation, and Pendulum exercises.  Discussed possible needing a long handle sponge to wash her right arm and axillary region until her AROM improves.  Pt reports having a sponge she could use.  Educated on proper clothing to wear as well and positioning for sleeping.  Pt will have home CPM and outpatient PT per MD recommendation.      Visit Information  Last OT Received On: 09/23/12 Assistance Needed: +1 History of Present Illness: Pt is a 62 yr old female admitted for SHOULDER ARTHROSCOPY WITH DEBRIDEMENT AND BICEP TENDON REPAIR (Left) - Arthroscopy Shoulder with manipulation under anesthesia.       Prior Functioning     Home Living Family/patient expects to be discharged to:: Private residence Living Arrangements: Alone Available Help at Discharge: None Home Equipment: None Prior Function Level of Independence: Independent Communication Communication: No difficulties Dominant Hand: Right         Vision/Perception Vision - History Baseline Vision: No visual deficits Patient Visual Report: No change from baseline Vision - Assessment Eye Alignment: Within Functional Limits Vision Assessment: Vision not tested Perception Perception: Within Functional Limits   Cognition  Cognition Arousal/Alertness: Awake/alert Behavior During Therapy: WFL for tasks assessed/performed Overall Cognitive Status: Within Functional Limits for tasks assessed    Extremity/Trunk Assessment Upper Extremity Assessment Upper Extremity Assessment: LUE deficits/detail LUE Deficits / Details: Pt with LUE shoulder debridement and biceps tendon repair.  Pt able to demonstrate full AROM flexion/extension for the elbow,  wrist, and digits.  Able to tolerate AAROM shoulder flexion to 90  degrees and external rotation to 30 degree. LUE: Unable to fully assess due to immobilization Lower Extremity Assessment Lower Extremity Assessment: Overall WFL for  tasks assessed Cervical / Trunk Assessment Cervical / Trunk Assessment: Normal     Mobility Bed Mobility Bed Mobility: Supine to Sit Supine to Sit: 7: Independent Transfers Transfers: Sit to Stand;Stand to Sit Sit to Stand: 7: Independent Stand to Sit: 7: Independent     Exercise Shoulder Exercises Pendulum Exercise: AAROM;Left;15 reps;Standing Shoulder Flexion: AAROM;Left;15 reps;Seated Shoulder External Rotation: AAROM;Seated;Left;15 reps Donning/doffing shirt without moving shoulder: Modified independent Method for sponge bathing under operated UE: Modified independent Pendulum exercises (written home exercise program): Independent ROM for elbow, wrist and digits of operated UE: Independent Proper positioning of operated UE when showering: Independent Dressing change: Independent Positioning of UE while sleeping: Independent   Balance Balance Balance Assessed: Yes Dynamic Standing Balance Dynamic Standing - Balance Support: No upper extremity supported Dynamic Standing - Level of Assistance: 7: Independent   End of Session OT - End of Session Activity Tolerance: Patient tolerated treatment well Patient left: in chair;with call bell/phone within reach Nurse Communication: Other (comment) (Pt completed OT session)  GO Functional Assessment Tool Used: clincial judgement Functional Limitation: Self care Self Care Current Status (A5409): At least 1 percent but less than 20 percent impaired, limited or restricted Self Care Goal Status (W1191): At least 1 percent but less than 20 percent impaired, limited or restricted Self Care Discharge Status [phone]): At least 1 percent but less than 20 percent impaired, limited or restricted   Jowan Skillin OTR/L Pager number [phone] 09/23/2012, 11:08 AM

## 2012-09-23 NOTE — Discharge Summary (Signed)
Physician Discharge Summary  Patient ID: Leah Gomez MRN: [id number] DOB/AGE: 1950/07/28 62 y.o.  Admit date: 09/22/2012 Discharge date: 09/23/2012  Admission Diagnoses:  Frozen shoulder Discharge Diagnoses:  Same  Surgeries: Procedure(s): SHOULDER ARTHROSCOPY WITH DEBRIDEMENT AND manipulation on 09/22/2012   Consultants:    Discharged Condition: Stable  Hospital Course: Leah Gomez is an 62 y.o. female who was admitted 09/22/2012 with a chief complaint of left shoulder pain, and found to have a diagnosis of frozen shoulder.  They were brought to the operating room on 09/22/2012 and underwent the above named procedures.  DCed home with PT and CPM machine to maintain rom - dc sling today  Antibiotics given:  Anti-infectives   Start     Dose/Rate Route Frequency Ordered Stop   09/22/12 1800  clindamycin (CLEOCIN) IVPB 600 mg     600 mg 100 mL/hr over 30 Minutes Intravenous Every 6 hours 09/22/12 1638 09/23/12 0638   09/22/12 0600  clindamycin (CLEOCIN) IVPB 900 mg     900 mg 100 mL/hr over 30 Minutes Intravenous On call to O.R. 09/21/12 1244 09/22/12 1318    .  Recent vital signs:  Filed Vitals:   09/23/12 0554  BP: 98/40  Pulse: 60  Temp: 97.9 F (36.6 C)  Resp: 18    Recent laboratory studies:  Results for orders placed during the hospital encounter of 09/21/12  SURGICAL PCR SCREEN      Result Value Range   MRSA, PCR NEGATIVE  NEGATIVE   Staphylococcus aureus NEGATIVE  NEGATIVE  BASIC METABOLIC PANEL      Result Value Range   Sodium 137  135 - 145 mEq/L   Potassium 3.7  3.5 - 5.1 mEq/L   Chloride 98  96 - 112 mEq/L   CO2 31  19 - 32 mEq/L   Glucose, Bld 94  70 - 99 mg/dL   BUN 13  6 - 23 mg/dL   Creatinine, Ser 7.82  0.50 - 1.10 mg/dL   Calcium 9.5  8.4 - 95.6 mg/dL   GFR calc non Af Amer 74 (*) >90 mL/min   GFR calc Af Amer 85 (*) >90 mL/min  CBC      Result Value Range   WBC 10.0  4.0 - 10.5 K/uL   RBC 4.49  3.87 - 5.11 MIL/uL   Hemoglobin 14.1  12.0 - 15.0 g/dL   HCT 21.3  08.6 - 57.8 %   MCV 92.7  78.0 - 100.0 fL   MCH 31.4  26.0 - 34.0 pg   MCHC 33.9  30.0 - 36.0 g/dL   RDW 46.9  62.9 - 52.8 %   Platelets 184  150 - 400 K/uL    Discharge Medications:     Medication List    STOP taking these medications       diclofenac 1.3 % Ptch  Commonly known as:  FLECTOR      TAKE these medications       BLACK CURRANT SEED OIL PO  Take 1,000 mg by mouth daily.     digoxin 0.125 MG tablet  Commonly known as:  LANOXIN  Take 0.125 mg by mouth daily.     docusate sodium 100 MG capsule  Commonly known as:  COLACE  Take 1 capsule (100 mg total) by mouth 2 (two) times daily.     fluticasone 50 MCG/ACT nasal spray  Commonly known as:  FLONASE  Place 2 sprays into the nose daily.     furosemide 20  MG tablet  Commonly known as:  LASIX  Take 20 mg by mouth daily as needed (for swelling).     HYDROcodone-acetaminophen 10-325 MG per tablet  Commonly known as:  NORCO  Take 1 tablet by mouth every 4 (four) hours as needed for pain.     hydroxypropyl methylcellulose 2.5 % ophthalmic solution  Commonly known as:  ISOPTO TEARS  Place 1 drop into both eyes 4 (four) times daily as needed.     Krill Oil 300 MG Caps  Take 300 mg by mouth daily.     Levocarnitine 500 MG Tabs  Take 500 mg by mouth daily.     magnesium oxide 400 MG tablet  Commonly known as:  MAG-OX  Take 400 mg by mouth daily.     methocarbamol 500 MG tablet  Commonly known as:  ROBAXIN  Take 1 tablet (500 mg total) by mouth 4 (four) times daily.     nebivolol 2.5 MG tablet  Commonly known as:  BYSTOLIC  Take 2.5 mg by mouth daily.     OVER THE COUNTER MEDICATION  Take 12 mg by mouth daily. BioAstin     PYCNOGENOL PO  Take 50 mg by mouth daily.     ramipril 1.25 MG capsule  Commonly known as:  ALTACE  Take 1.25 mg by mouth daily.     spironolactone 25 MG tablet  Commonly known as:  ALDACTONE  Take 12.5 mg by mouth daily.      traMADol 100 MG 24 hr tablet  Commonly known as:  ULTRAM-ER  Take 100 mg by mouth daily.     Turmeric 500 MG Caps  Take 500 mg by mouth daily.        Diagnostic Studies: Dg Chest 2 View  09/21/2012   *RADIOLOGY REPORT*  Clinical Data: Shoulder arthroscopy with debridement and biceps tendon repair.  Cardiomyopathy and history of CHF, mitral regurgitation.  CHEST - 2 VIEW  Comparison: None.  Findings: The patient has left-sided AICD / transvenous pacemaker. Leads overlie the right atrium, right ventricle, and coronary sinus.  Heart size is normal.  The lungs are clear.  No edema. Visualized osseous structures have a normal appearance.  IMPRESSION: No evidence for acute cardiopulmonary abnormality.   Original Report Authenticated By: Norva Pavlov, M.D.    Disposition: Final discharge disposition not confirmed      Discharge Orders   Future Orders Complete By Expires     Call MD / Call 911  As directed     Comments:      If you experience chest pain or shortness of breath, CALL 911 and be transported to the hospital emergency room.  If you develope a fever above 101 F, pus (white drainage) or increased drainage or redness at the wound, or calf pain, call your surgeon's office.    Constipation Prevention  As directed     Comments:      Drink plenty of fluids.  Prune juice may be helpful.  You may use a stool softener, such as Colace (over the counter) 100 mg twice a day.  Use MiraLax (over the counter) for constipation as needed.    Diet - low sodium heart healthy  As directed     Discharge instructions  As directed     Comments:      1. Keep incisions dry 2. CPM 6 hours per day - 2 per 8 hours - increase daily the degrees 3. PT to begin this week rx provided  Increase activity slowly as tolerated  As directed           Signed: Victorian Gunn Gomez 09/23/2012, 8:13 AM

## 2012-09-23 NOTE — Progress Notes (Signed)
Discharge home , Home discharge instruction given, no questions verbalized. 

## 2013-09-16 ENCOUNTER — Other Ambulatory Visit (HOSPITAL_COMMUNITY): Payer: Self-pay | Admitting: Orthopedic Surgery

## 2013-09-21 NOTE — Pre-Procedure Instructions (Signed)
QUANITA WOLVERTON  09/21/2013   Your procedure is scheduled on:  Tuesday, June 30th   Report to [facility] Admitting at  10:54 AM.   Call this number if you have problems the morning of surgery: [phone]   Remember:   Do not eat food or drink liquids after midnight Monday.    Take these medicines the morning of surgery with A SIP OF WATER: Bystolic, Hydrocodone.  Please use flonase.   Do not wear jewelry, make-up or nail polish.  Do not wear lotions, powders, or perfumes. You may NOT wear deodorant.  Do not shave underarms & legs 48 hours prior to surgery.    Do not bring valuables to the hospital.  [facility] is not responsible for any belongings or valuables.               Contacts, dentures or bridgework may not be worn into surgery.  Leave suitcase in the car. After surgery it may be brought to your room.  For patients admitted to the hospital, discharge time is determined by your treatment team.               Name and phone number of your driver:    Special Instructions: "Preparing for Surgery" instruction sheet   Please read over the following fact sheets that you were given: Pain Booklet and Surgical Site Infection Prevention

## 2013-09-22 ENCOUNTER — Encounter (HOSPITAL_COMMUNITY): Payer: Self-pay

## 2013-09-22 ENCOUNTER — Encounter (HOSPITAL_COMMUNITY)
Admission: RE | Admit: 2013-09-22 | Discharge: 2013-09-22 | Disposition: A | Discharge status: Home / Self Care | Payer: BC Managed Care – PPO | Source: Ambulatory Visit | Attending: Orthopedic Surgery | Admitting: Orthopedic Surgery

## 2013-09-22 ENCOUNTER — Ambulatory Visit (HOSPITAL_COMMUNITY)
Admission: RE | Admit: 2013-09-22 | Discharge: 2013-09-22 | Disposition: A | Discharge status: Home / Self Care | Payer: BC Managed Care – PPO | Source: Ambulatory Visit | Attending: Orthopedic Surgery | Admitting: Orthopedic Surgery

## 2013-09-22 ENCOUNTER — Encounter (HOSPITAL_COMMUNITY): Payer: Self-pay | Admitting: Pharmacy Technician

## 2013-09-22 DIAGNOSIS — I428 Other cardiomyopathies: Secondary | ICD-10-CM | POA: Insufficient documentation

## 2013-09-22 HISTORY — DX: Adverse effect of unspecified anesthetic, initial encounter: T41.45XA

## 2013-09-22 HISTORY — DX: Other complications of anesthesia, initial encounter: T88.59XA

## 2013-09-22 HISTORY — DX: Other specified cardiac arrhythmias: I49.8

## 2013-09-22 LAB — CBC
HEMATOCRIT: 40.9 % (ref 36.0–46.0)
Hemoglobin: 13.2 g/dL (ref 12.0–15.0)
MCH: 31.4 pg (ref 26.0–34.0)
MCHC: 32.3 g/dL (ref 30.0–36.0)
MCV: 97.4 fL (ref 78.0–100.0)
Platelets: 202 10*3/uL (ref 150–400)
RBC: 4.2 MIL/uL (ref 3.87–5.11)
RDW: 12.6 % (ref 11.5–15.5)
WBC: 7.8 10*3/uL (ref 4.0–10.5)

## 2013-09-22 LAB — COMPREHENSIVE METABOLIC PANEL
ALBUMIN: 4.2 g/dL (ref 3.5–5.2)
ALK PHOS: 48 U/L (ref 39–117)
ALT: 20 U/L (ref 0–35)
AST: 24 U/L (ref 0–37)
BILIRUBIN TOTAL: 0.3 mg/dL (ref 0.3–1.2)
BUN: 15 mg/dL (ref 6–23)
CHLORIDE: 100 meq/L (ref 96–112)
CO2: 30 mEq/L (ref 19–32)
Calcium: 9.7 mg/dL (ref 8.4–10.5)
Creatinine, Ser: 0.82 mg/dL (ref 0.50–1.10)
GFR calc Af Amer: 88 mL/min — ABNORMAL LOW (ref >90)
GFR calc non Af Amer: 76 mL/min — ABNORMAL LOW (ref >90)
Glucose, Bld: 99 mg/dL (ref 70–99)
POTASSIUM: 4.2 meq/L (ref 3.7–5.3)
SODIUM: 141 meq/L (ref 137–147)
Total Protein: 7.7 g/dL (ref 6.0–8.3)

## 2013-09-22 NOTE — Progress Notes (Addendum)
PCP is Dr. Florina Ou @ [facility] in [place], [place] Dr. Malissa Hippo is her cardio--@ [facility]   LOV Feb 12th. (requested info) Dr Christin Fudge is EP physician from [facility]. (requested info) Pt has Brugada Symdrome which indicates only she have certain anesthesics... I have sent ICD/PPM sheet to Dr. Eldridge Scot office.  I spoke with Johann Capers -  St. Jude Rep and informed her of time and place for surgery.  [phone]

## 2013-09-22 NOTE — Anesthesia Preprocedure Evaluation (Addendum)
Anesthesia Evaluation  Patient identified by MRN, date of birth, ID band Patient awake    Reviewed: Allergy & Precautions, H&P , NPO status , Patient's Chart, lab work & pertinent test results, reviewed documented beta blocker date and time   History of Anesthesia Complications (+) DIFFICULT AIRWAY and history of anesthetic complications  Airway Mallampati: II TM Distance: >3 FB Neck ROM: full    Dental   Pulmonary neg pulmonary ROS,  breath sounds clear to auscultation        Cardiovascular hypertension, On Medications and On Home Beta Blockers +CHF negative cardio ROS  - pacemaker+ Cardiac Defibrillator + Valvular Problems/Murmurs MR Rhythm:regular  Extensive cardiac history noted. CF   Neuro/Psych  Headaches,  Neuromuscular disease negative psych ROS   GI/Hepatic negative GI ROS, Neg liver ROS,   Endo/Other  negative endocrine ROS  Renal/GU negative Renal ROS  negative genitourinary   Musculoskeletal   Abdominal   Peds  Hematology negative hematology ROS (+)   Anesthesia Other Findings See surgeon's H&P   Reproductive/Obstetrics negative OB ROS                          Anesthesia Physical Anesthesia Plan  ASA: III  Anesthesia Plan: General   Post-op Pain Management:    Induction: Intravenous  Airway Management Planned: Oral ETT and Video Laryngoscope Planned  Additional Equipment:   Intra-op Plan:   Post-operative Plan: Extubation in OR  Informed Consent: I have reviewed the patients History and Physical, chart, labs and discussed the procedure including the risks, benefits and alternatives for the proposed anesthesia with the patient or authorized representative who has indicated his/her understanding and acceptance.   Dental Advisory Given  Plan Discussed with: CRNA and Surgeon  Anesthesia Plan Comments: (See my anesthesia note.  Has non-ischemic CM, Brugada Syndrome (list  of drugs to avoid was placed in her hard chart), left sided St. Jude ICD.  Shonna Chock, PA-C  Noted, CF)       Anesthesia Quick Evaluation

## 2013-09-22 NOTE — Progress Notes (Addendum)
Anesthesia PAT Evaluation:  Patient is a 63 year old female scheduled for right shoulder arthroscopy with subacromial decompression, manipulation, debridement, and possible distal clavicle excision on 09/27/13 by Dr. August Saucerean.  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 Lanell PersonsMatthew Wolf/Dagny Noeth, CGC), NYHA Class II/III CHF, moderate mitral regurgitation '13, non-obstructive CAD by 2010 cath, left BBB, s/p St. Jude biventricular ICD 03/28/09 (left sided), HTN, fibromyalgia, chronic back pain, headaches, posterior cervical laminectomy '96, ACDF '00, left biceps tendon repair '14, peptic ulcer '70's, xerophthalmia. DIFFICULT INTUBATION on 09/22/12. PCP is Dr. Leticia Pennaebra Baskett in [place], [place]. EP Cardiologist is Dr. Marvel Planonald Dale Hegland [facility]), last visit with interrogation under Care Everywhere is from 05/13/13. Primary cardiologist is Dr. Oralia Manishomas Richard Gehrig [facility]), last visit 09/21/13 with note requested, but still pending.  She reportedly notified him of plans for surgery.   [facility].  Cardiopulmonary Exercise Test on 03/02/13 [facility], Care Everywhere) showed:  1) Mild functional impairment with peak oxygen consumption (VO2) of 22.1 ml/kg/min (87% predicted). 2) Exercise capacity is unchanged from 2012, at which time peak VO2 was 22.5 mL/kg/min (83% predicted). 3) Exercise was limited by a circulatory impairment, with evidence of a decline in stroke volume augmentation near peak exercise. 4) No pulmonary limitations were observed during exercise. This is the metabolic report for the Cardiopulmonary Exercise Test. The Stress ECG report is available on a separate report. (Requested).  Echo on 02/18/12 (Care Everywhere) showed: SEVERE LV DYSFUNCTION, ESTIMATED EF 25%, NORMAL RIGHT VENTRICULAR SYSTOLIC FUNCTION. VALVULAR REGURGITATION: MODERATE MR, MILD TR. NO VALVULAR STENOSIS. TRIVIAL PERICARDIAL  EFFUSION.  Cardiac cath on 07/18/08 (Care Everywhere) showed: Normal coronaries, EF 34%, no pulmonary hypertension. EKG report from 02/17/13 in Care Everywhere states she had ventriclar paced rhythm.  Will request the actual tracing.  Preoperative CXR and labs noted.    Exam shows a pleasant Caucasian female in NAD.  BMI 23.66.  Heart RRR, left sided AICD. Lungs clear.  No significant pedal edema.  No s/s of acute CHF noted.   Patient feels well from a CV/CHF standpoint.  She has had a little weight gain due to less activity related to her shoulder pain.  She is typically an avid swimmer, but hasn't been able to swim due to her shoulder pain.  She has been riding her bike on a paved trail on a regular basis.  She has not had any new SOB or pedal edema.  No ICD discharges.  She does have limited neck mobility.  We did discuss that that anesthesiologist felt she was a difficult intubation at the time of her last surgery.  Three attempts were made, stylet with video laryngoscopy were utilized. Recommendation for induction with short-acting agent, and alternative techniques readily available were made. Of note, she was diagnosed with Brugada Syndrome since her last surgery.  Propofol was used then without known complication, but it is now on the list of drugs to be avoided with this syndrome. I have printed information about Brugada Syndrome and medications to avoid and placed in on her hard chart.  I also reviewed her cardiac and anesthesia history with anesthesiologist Dr. Katrinka BlazingSmith.  She will meet with her assigned anesthesiologist on the day of surgery to discuss the definitive anesthesia plan.  I'll follow-[facility] once available.  Leah Ochsllison Zelenak, PA-C [facility]/Anesthesiology Phone [phone] 09/22/2013 5:33 PM  09/27/2013 9:55 AM [facility]  Dr. Denman GeorgeGehrig's  note from 09/21/13 is still not available.  Stress EKG reports sent.  Resting  stress EKG on 03/02/13 showed v-paced rhythm. ICD perioperative device for still pending from Dr. Christin Fudge.  I'll ask nursing staff to go ahead a notify the St. Jude rep since I anticipate need for perioperative deactivation since this is a shoulder procedure.

## 2013-09-27 MED ORDER — CLINDAMYCIN PHOSPHATE 900 MG/50ML IV SOLN
900.0000 mg | INTRAVENOUS | Status: AC
  Administered 2013-09-28: 900 mg via INTRAVENOUS
  Filled 2013-09-27: qty 50

## 2013-09-27 NOTE — Progress Notes (Signed)
refaxed Periop PPM form to be filled out and faxed.

## 2013-09-28 ENCOUNTER — Observation Stay (HOSPITAL_COMMUNITY)
Admission: RE | Admit: 2013-09-28 | Discharge: 2013-09-29 | Disposition: A | Discharge status: Home / Self Care | Payer: BC Managed Care – PPO | Source: Ambulatory Visit | Attending: Orthopedic Surgery | Admitting: Orthopedic Surgery

## 2013-09-28 ENCOUNTER — Encounter (HOSPITAL_COMMUNITY): Payer: Self-pay | Admitting: *Deleted

## 2013-09-28 ENCOUNTER — Encounter (HOSPITAL_COMMUNITY): Payer: BC Managed Care – PPO | Admitting: Vascular Surgery

## 2013-09-28 ENCOUNTER — Encounter (HOSPITAL_COMMUNITY)
Admission: RE | Disposition: A | Discharge status: Home / Self Care | Payer: Self-pay | Source: Ambulatory Visit | Attending: Orthopedic Surgery

## 2013-09-28 ENCOUNTER — Ambulatory Visit (HOSPITAL_COMMUNITY): Payer: BC Managed Care – PPO | Admitting: Anesthesiology

## 2013-09-28 DIAGNOSIS — I428 Other cardiomyopathies: Secondary | ICD-10-CM | POA: Insufficient documentation

## 2013-09-28 DIAGNOSIS — M1990 Unspecified osteoarthritis, unspecified site: Secondary | ICD-10-CM | POA: Insufficient documentation

## 2013-09-28 DIAGNOSIS — R011 Cardiac murmur, unspecified: Secondary | ICD-10-CM | POA: Insufficient documentation

## 2013-09-28 DIAGNOSIS — G8929 Other chronic pain: Secondary | ICD-10-CM | POA: Insufficient documentation

## 2013-09-28 DIAGNOSIS — I4510 Unspecified right bundle-branch block: Secondary | ICD-10-CM | POA: Insufficient documentation

## 2013-09-28 DIAGNOSIS — Z95810 Presence of automatic (implantable) cardiac defibrillator: Secondary | ICD-10-CM | POA: Insufficient documentation

## 2013-09-28 DIAGNOSIS — M7500 Adhesive capsulitis of unspecified shoulder: Principal | ICD-10-CM | POA: Diagnosis present

## 2013-09-28 DIAGNOSIS — Q248 Other specified congenital malformations of heart: Secondary | ICD-10-CM | POA: Insufficient documentation

## 2013-09-28 DIAGNOSIS — R51 Headache: Secondary | ICD-10-CM | POA: Insufficient documentation

## 2013-09-28 DIAGNOSIS — M7501 Adhesive capsulitis of right shoulder: Secondary | ICD-10-CM

## 2013-09-28 DIAGNOSIS — I10 Essential (primary) hypertension: Secondary | ICD-10-CM | POA: Insufficient documentation

## 2013-09-28 DIAGNOSIS — IMO0001 Reserved for inherently not codable concepts without codable children: Secondary | ICD-10-CM | POA: Insufficient documentation

## 2013-09-28 DIAGNOSIS — IMO0002 Reserved for concepts with insufficient information to code with codable children: Secondary | ICD-10-CM | POA: Insufficient documentation

## 2013-09-28 HISTORY — PX: SHOULDER ARTHROSCOPY WITH DEBRIDEMENT AND BICEP TENDON REPAIR: SHX5690

## 2013-09-28 SURGERY — SHOULDER ARTHROSCOPY WITH SUBACROMIAL DECOMPRESSION AND DISTAL CLAVICLE EXCISION
Anesthesia: General | Site: Shoulder | Laterality: Right

## 2013-09-28 MED ORDER — ONDANSETRON HCL 4 MG/2ML IJ SOLN
INTRAMUSCULAR | Status: DC | PRN
  Administered 2013-09-28: 4 mg via INTRAVENOUS

## 2013-09-28 MED ORDER — HYDROCODONE-ACETAMINOPHEN 5-325 MG PO TABS
1.0000 | ORAL_TABLET | ORAL | Status: DC | PRN
  Administered 2013-09-28 (×2): 1 via ORAL
  Administered 2013-09-29 (×2): 2 via ORAL
  Filled 2013-09-28 (×2): qty 1
  Filled 2013-09-28 (×2): qty 2

## 2013-09-28 MED ORDER — SPIRONOLACTONE 12.5 MG HALF TABLET
12.5000 mg | ORAL_TABLET | Freq: Every day | ORAL | Status: DC
  Administered 2013-09-28 – 2013-09-29 (×2): 12.5 mg via ORAL
  Filled 2013-09-28 (×2): qty 1

## 2013-09-28 MED ORDER — SUCCINYLCHOLINE CHLORIDE 20 MG/ML IJ SOLN
INTRAMUSCULAR | Status: DC | PRN
  Administered 2013-09-28: 100 mg via INTRAVENOUS

## 2013-09-28 MED ORDER — ONDANSETRON HCL 4 MG PO TABS: 4.0000 mg | ORAL_TABLET | Freq: Four times a day (QID) | ORAL | Status: DC | PRN

## 2013-09-28 MED ORDER — DEXTROSE 5 % IV SOLN
INTRAVENOUS | Status: DC | PRN
  Administered 2013-09-28: 11:00:00 via INTRAVENOUS

## 2013-09-28 MED ORDER — ARTIFICIAL TEARS OP OINT
TOPICAL_OINTMENT | OPHTHALMIC | Status: AC
  Filled 2013-09-28: qty 3.5

## 2013-09-28 MED ORDER — MORPHINE SULFATE 4 MG/ML IJ SOLN
INTRAMUSCULAR | Status: AC
  Filled 2013-09-28: qty 1

## 2013-09-28 MED ORDER — PROPOFOL 10 MG/ML IV BOLUS
INTRAVENOUS | Status: AC
  Filled 2013-09-28: qty 20

## 2013-09-28 MED ORDER — LACTATED RINGERS IV SOLN
INTRAVENOUS | Status: DC
  Administered 2013-09-28: 10:00:00 via INTRAVENOUS

## 2013-09-28 MED ORDER — METOCLOPRAMIDE HCL 5 MG/ML IJ SOLN: 10.0000 mg | Freq: Once | INTRAMUSCULAR | Status: DC | PRN

## 2013-09-28 MED ORDER — FLUTICASONE PROPIONATE 50 MCG/ACT NA SUSP
2.0000 | Freq: Every day | NASAL | Status: DC
  Filled 2013-09-28: qty 16

## 2013-09-28 MED ORDER — OXYCODONE HCL 5 MG/5ML PO SOLN: 5.0000 mg | Freq: Once | ORAL | Status: AC | PRN

## 2013-09-28 MED ORDER — RAMIPRIL 1.25 MG PO CAPS
1.2500 mg | ORAL_CAPSULE | Freq: Every day | ORAL | Status: DC
  Administered 2013-09-28: 1.25 mg via ORAL
  Filled 2013-09-28 (×2): qty 1

## 2013-09-28 MED ORDER — SODIUM CHLORIDE 0.9 % IJ SOLN
INTRAMUSCULAR | Status: DC | PRN
  Administered 2013-09-28: 30 mL via INTRAVENOUS

## 2013-09-28 MED ORDER — NEOSTIGMINE METHYLSULFATE 10 MG/10ML IV SOLN
INTRAVENOUS | Status: DC | PRN
  Administered 2013-09-28: 5 mg via INTRAVENOUS

## 2013-09-28 MED ORDER — EPINEPHRINE HCL 1 MG/ML IJ SOLN
INTRAMUSCULAR | Status: AC
  Filled 2013-09-28: qty 1

## 2013-09-28 MED ORDER — HYDROMORPHONE HCL PF 1 MG/ML IJ SOLN
INTRAMUSCULAR | Status: AC
  Filled 2013-09-28: qty 2

## 2013-09-28 MED ORDER — METOCLOPRAMIDE HCL 10 MG PO TABS: 5.0000 mg | ORAL_TABLET | Freq: Three times a day (TID) | ORAL | Status: DC | PRN

## 2013-09-28 MED ORDER — LEVOCARNITINE 500 MG PO TABS: 500.0000 mg | ORAL_TABLET | Freq: Every day | ORAL | Status: DC

## 2013-09-28 MED ORDER — OXYCODONE HCL 5 MG PO TABS
ORAL_TABLET | ORAL | Status: AC
  Filled 2013-09-28: qty 1

## 2013-09-28 MED ORDER — MIDAZOLAM HCL 2 MG/2ML IJ SOLN
INTRAMUSCULAR | Status: AC
  Filled 2013-09-28: qty 2

## 2013-09-28 MED ORDER — OXYCODONE HCL 5 MG PO TABS
5.0000 mg | ORAL_TABLET | Freq: Once | ORAL | Status: AC | PRN
  Administered 2013-09-28: 5 mg via ORAL

## 2013-09-28 MED ORDER — CLONIDINE HCL (ANALGESIA) 100 MCG/ML EP SOLN
150.0000 ug | EPIDURAL | Status: AC
  Administered 2013-09-28: .6 mL via INTRA_ARTICULAR
  Filled 2013-09-28: qty 1.5

## 2013-09-28 MED ORDER — CLINDAMYCIN PHOSPHATE 900 MG/50ML IV SOLN
900.0000 mg | Freq: Once | INTRAVENOUS | Status: AC
  Administered 2013-09-29: 900 mg via INTRAVENOUS
  Filled 2013-09-28: qty 50

## 2013-09-28 MED ORDER — HYDROMORPHONE HCL PF 1 MG/ML IJ SOLN
0.2500 mg | INTRAMUSCULAR | Status: DC | PRN
Start: 2013-09-28 — End: 2013-09-28
  Administered 2013-09-28 (×4): 0.5 mg via INTRAVENOUS

## 2013-09-28 MED ORDER — DIGOXIN 125 MCG PO TABS
0.1250 mg | ORAL_TABLET | Freq: Every morning | ORAL | Status: DC
  Administered 2013-09-28 – 2013-09-29 (×2): 0.125 mg via ORAL
  Filled 2013-09-28 (×2): qty 1

## 2013-09-28 MED ORDER — MAGNESIUM OXIDE 400 (241.3 MG) MG PO TABS
400.0000 mg | ORAL_TABLET | Freq: Every day | ORAL | Status: DC
  Administered 2013-09-29: 400 mg via ORAL
  Filled 2013-09-28 (×2): qty 1

## 2013-09-28 MED ORDER — DEXAMETHASONE SODIUM PHOSPHATE 4 MG/ML IJ SOLN
INTRAMUSCULAR | Status: DC | PRN
  Administered 2013-09-28: 8 mg via INTRAVENOUS

## 2013-09-28 MED ORDER — EPINEPHRINE HCL 1 MG/ML IJ SOLN
INTRAMUSCULAR | Status: DC | PRN
  Administered 2013-09-28: .1 mg

## 2013-09-28 MED ORDER — LACTATED RINGERS IV SOLN
INTRAVENOUS | Status: DC | PRN
  Administered 2013-09-28: 11:00:00 via INTRAVENOUS

## 2013-09-28 MED ORDER — NEBIVOLOL HCL 2.5 MG PO TABS
2.5000 mg | ORAL_TABLET | Freq: Every day | ORAL | Status: DC
  Administered 2013-09-29: 2.5 mg via ORAL
  Filled 2013-09-28 (×2): qty 1

## 2013-09-28 MED ORDER — PROPOFOL 10 MG/ML IV BOLUS
INTRAVENOUS | Status: DC | PRN
  Administered 2013-09-28: 100 mg via INTRAVENOUS

## 2013-09-28 MED ORDER — MAGNESIUM OXIDE 400 MG PO TABS: 400.0000 mg | ORAL_TABLET | Freq: Every day | ORAL | Status: DC

## 2013-09-28 MED ORDER — GLYCOPYRROLATE 0.2 MG/ML IJ SOLN
INTRAMUSCULAR | Status: DC | PRN
  Administered 2013-09-28: 0.6 mg via INTRAVENOUS

## 2013-09-28 MED ORDER — FENTANYL CITRATE 0.05 MG/ML IJ SOLN
INTRAMUSCULAR | Status: AC
  Filled 2013-09-28: qty 5

## 2013-09-28 MED ORDER — SODIUM CHLORIDE 0.9 % IR SOLN
Status: DC | PRN
  Administered 2013-09-28: 3000 mL

## 2013-09-28 MED ORDER — ONDANSETRON HCL 4 MG/2ML IJ SOLN
INTRAMUSCULAR | Status: AC
  Filled 2013-09-28: qty 2

## 2013-09-28 MED ORDER — ARTIFICIAL TEARS OP OINT
TOPICAL_OINTMENT | OPHTHALMIC | Status: DC | PRN
  Administered 2013-09-28: 1 via OPHTHALMIC

## 2013-09-28 MED ORDER — FENTANYL CITRATE 0.05 MG/ML IJ SOLN
INTRAMUSCULAR | Status: DC | PRN
  Administered 2013-09-28: 50 ug via INTRAVENOUS
  Administered 2013-09-28 (×2): 100 ug via INTRAVENOUS

## 2013-09-28 MED ORDER — MIDAZOLAM HCL 5 MG/5ML IJ SOLN
INTRAMUSCULAR | Status: DC | PRN
  Administered 2013-09-28: 2 mg via INTRAVENOUS

## 2013-09-28 MED ORDER — BUPIVACAINE HCL (PF) 0.25 % IJ SOLN
INTRAMUSCULAR | Status: AC
  Filled 2013-09-28: qty 30

## 2013-09-28 MED ORDER — VECURONIUM BROMIDE 10 MG IV SOLR
INTRAVENOUS | Status: DC | PRN
  Administered 2013-09-28: 4 mg via INTRAVENOUS

## 2013-09-28 MED ORDER — METOCLOPRAMIDE HCL 5 MG/ML IJ SOLN: 5.0000 mg | Freq: Three times a day (TID) | INTRAMUSCULAR | Status: DC | PRN

## 2013-09-28 MED ORDER — ONDANSETRON HCL 4 MG/2ML IJ SOLN: 4.0000 mg | Freq: Four times a day (QID) | INTRAMUSCULAR | Status: DC | PRN

## 2013-09-28 MED ORDER — DEXAMETHASONE SODIUM PHOSPHATE 4 MG/ML IJ SOLN
INTRAMUSCULAR | Status: AC
Start: 2013-09-28 — End: 2013-09-28
  Filled 2013-09-28: qty 2

## 2013-09-28 MED ORDER — MORPHINE SULFATE 4 MG/ML IJ SOLN
INTRAMUSCULAR | Status: DC | PRN
  Administered 2013-09-28: 5 mg via INTRAVENOUS

## 2013-09-28 SURGICAL SUPPLY — 76 items
BENZOIN TINCTURE PRP APPL 2/3 (GAUZE/BANDAGES/DRESSINGS) ×3 IMPLANT
BIT DRILL TAK (DRILL) IMPLANT
BLADE CUDA 5.5 (BLADE) IMPLANT
BLADE CUTTER GATOR 3.5 (BLADE) ×3 IMPLANT
BLADE GREAT WHITE 4.2 (BLADE) ×2 IMPLANT
BLADE GREAT WHITE 4.2MM (BLADE) ×1
BLADE SURG 11 STRL SS (BLADE) ×3 IMPLANT
BUR GATOR 2.9 (BURR) IMPLANT
BUR GATOR 2.9MM (BURR)
BUR OVAL 6.0 (BURR) ×3 IMPLANT
CANNULA SHOULDER 7CM (CANNULA) IMPLANT
CARTRIDGE CURVETEK MED (MISCELLANEOUS) IMPLANT
CARTRIDGE CURVETEK XLRG (MISCELLANEOUS) IMPLANT
CLOSURE WOUND 1/2 X4 (GAUZE/BANDAGES/DRESSINGS) ×1
COVER SURGICAL LIGHT HANDLE (MISCELLANEOUS) ×3 IMPLANT
DRAPE INCISE IOBAN 66X45 STRL (DRAPES) ×6 IMPLANT
DRAPE STERI 35X30 U-POUCH (DRAPES) ×3 IMPLANT
DRAPE U-SHAPE 47X51 STRL (DRAPES) ×6 IMPLANT
DRILL TAK (DRILL)
DRSG MEPILEX BORDER 4X4 (GAUZE/BANDAGES/DRESSINGS) ×3 IMPLANT
DRSG PAD ABDOMINAL 8X10 ST (GAUZE/BANDAGES/DRESSINGS) ×9 IMPLANT
DURAPREP 26ML APPLICATOR (WOUND CARE) ×3 IMPLANT
ELECT MENISCUS 165MM 90D (ELECTRODE) IMPLANT
ELECT REM PT RETURN 9FT ADLT (ELECTROSURGICAL) ×3
ELECTRODE REM PT RTRN 9FT ADLT (ELECTROSURGICAL) ×1 IMPLANT
FILTER STRAW FLUID ASPIR (MISCELLANEOUS) ×3 IMPLANT
GAUZE VASELINE 1X8 (GAUZE/BANDAGES/DRESSINGS) ×3 IMPLANT
GAUZE XEROFORM 1X8 LF (GAUZE/BANDAGES/DRESSINGS) ×3 IMPLANT
GLOVE BIOGEL PI IND STRL 7.5 (GLOVE) ×1 IMPLANT
GLOVE BIOGEL PI IND STRL 8 (GLOVE) ×1 IMPLANT
GLOVE BIOGEL PI INDICATOR 7.5 (GLOVE) ×2
GLOVE BIOGEL PI INDICATOR 8 (GLOVE) ×2
GLOVE ECLIPSE 7.0 STRL STRAW (GLOVE) ×3 IMPLANT
GLOVE SURG ORTHO 8.0 STRL STRW (GLOVE) ×3 IMPLANT
GOWN STRL REUS W/ TWL LRG LVL3 (GOWN DISPOSABLE) IMPLANT
GOWN STRL REUS W/ TWL XL LVL3 (GOWN DISPOSABLE) ×2 IMPLANT
GOWN STRL REUS W/TWL LRG LVL3 (GOWN DISPOSABLE)
GOWN STRL REUS W/TWL XL LVL3 (GOWN DISPOSABLE) ×4
KIT BASIN OR (CUSTOM PROCEDURE TRAY) ×3 IMPLANT
KIT ROOM TURNOVER OR (KITS) ×3 IMPLANT
MANIFOLD NEPTUNE II (INSTRUMENTS) ×3 IMPLANT
NDL SUT 6 .5 CRC .975X.05 MAYO (NEEDLE) ×1 IMPLANT
NEEDLE HYPO 25X1 1.5 SAFETY (NEEDLE) ×3 IMPLANT
NEEDLE MAYO TAPER (NEEDLE) ×2
NEEDLE SPNL 18GX3.5 QUINCKE PK (NEEDLE) ×3 IMPLANT
NS IRRIG 1000ML POUR BTL (IV SOLUTION) ×3 IMPLANT
PACK SHOULDER (CUSTOM PROCEDURE TRAY) ×3 IMPLANT
PAD ABD 8X10 STRL (GAUZE/BANDAGES/DRESSINGS) ×3 IMPLANT
PAD ARMBOARD 7.5X6 YLW CONV (MISCELLANEOUS) ×6 IMPLANT
SET ARTHROSCOPY TUBING (MISCELLANEOUS) ×2
SET ARTHROSCOPY TUBING LN (MISCELLANEOUS) ×1 IMPLANT
SLING ARM IMMOBILIZER MED (SOFTGOODS) IMPLANT
SPEAR FASTAKII (SLEEVE) IMPLANT
SPONGE GAUZE 4X4 12PLY (GAUZE/BANDAGES/DRESSINGS) ×3 IMPLANT
SPONGE GAUZE 4X4 12PLY STER LF (GAUZE/BANDAGES/DRESSINGS) ×3 IMPLANT
SPONGE LAP 4X18 X RAY DECT (DISPOSABLE) ×6 IMPLANT
STRIP CLOSURE SKIN 1/2X4 (GAUZE/BANDAGES/DRESSINGS) ×2 IMPLANT
SUCTION FRAZIER TIP 10 FR DISP (SUCTIONS) ×3 IMPLANT
SUT ETHILON 3 0 PS 1 (SUTURE) ×3 IMPLANT
SUT FIBERWIRE 2-0 18 17.9 3/8 (SUTURE)
SUT PROLENE 3 0 PS 2 (SUTURE) ×3 IMPLANT
SUT VIC AB 0 CT1 27 (SUTURE) ×4
SUT VIC AB 0 CT1 27XBRD ANBCTR (SUTURE) ×2 IMPLANT
SUT VIC AB 1 CT1 27 (SUTURE) ×2
SUT VIC AB 1 CT1 27XBRD ANBCTR (SUTURE) ×1 IMPLANT
SUT VIC AB 2-0 CT1 27 (SUTURE) ×2
SUT VIC AB 2-0 CT1 TAPERPNT 27 (SUTURE) ×1 IMPLANT
SUT VICRYL 0 UR6 27IN ABS (SUTURE) IMPLANT
SUTURE FIBERWR 2-0 18 17.9 3/8 (SUTURE) IMPLANT
SYR 20CC LL (SYRINGE) ×6 IMPLANT
SYR 3ML LL SCALE MARK (SYRINGE) ×3 IMPLANT
SYR TB 1ML LUER SLIP (SYRINGE) ×3 IMPLANT
TOWEL OR 17X24 6PK STRL BLUE (TOWEL DISPOSABLE) ×3 IMPLANT
TOWEL OR 17X26 10 PK STRL BLUE (TOWEL DISPOSABLE) ×3 IMPLANT
WAND HAND CNTRL MULTIVAC 90 (MISCELLANEOUS) IMPLANT
WATER STERILE IRR 1000ML POUR (IV SOLUTION) ×3 IMPLANT

## 2013-09-28 NOTE — Anesthesia Postprocedure Evaluation (Signed)
  Anesthesia Post-op Note  Patient: Leah Gomez  Procedure(s) Performed: Procedure(s): RIGHT SHOULDER ARTHROSCOPY WITH SUBACROMIAL DECOMPRESSION, MANIPULATION UNDER ANESTHESIA, DEBRIDEMENT  (Right)  Patient Location: PACU  Anesthesia Type:General  Level of Consciousness: awake, alert  and oriented  Airway and Oxygen Therapy: Patient Spontanous Breathing  Post-op Pain: moderate  Post-op Assessment: Post-op Vital signs reviewed  Post-op Vital Signs: Reviewed  Last Vitals:  Filed Vitals:   09/28/13 1332  BP: 122/57  Pulse: 60  Temp:   Resp: 22    Complications: No apparent anesthesia complications

## 2013-09-28 NOTE — Progress Notes (Signed)
Pacer and icd back on per [facility]

## 2013-09-28 NOTE — H&P (Signed)
Leah Gomez is an 63 y.o. female.   Chief Complaint: Right shoulder pain and stiffness HPI: Leah Gomez is a 10-year-old female with right shoulder pain and stiffness in his been on for about a year. She has a history of left shoulder pain and stiffness consistent with frozen shoulder which is treated successfully with manipulation debridement arthroscopically and intensive CPM and physical therapy. She's had similar constellation of symptoms on the right-hand side. She's failed conservative management including multiple injections physical therapy. She resents now for operative management of the right shoulder. She does have a history of low ejection fraction but his currently managed by her cardiologist is been risk stratified as far as her cardiac condition is concerned.  Past Medical History  Diagnosis Date  . Cardiomyopathy     Hx: of nonischemic  . CHF (congestive heart failure)   . Pacemaker   . Xerophthalmia     Hx: of  . Mitral regurgitation     Hx: of  . Cough     Hx: of  . Hypertension   . Heart murmur   . History of stomach ulcers 1970's  . DJD (degenerative joint disease)   . DDD (degenerative disc disease)     "my whole spine" (09/22/2012)  . Arthritis     "especially bad in my lower back" (09/22/2012)  . Chronic back pain     "neck thru lower back" (09/22/2012)  . Headache(784.0)     "probably monthly; from neck pain" (09/22/2012)  . Fibromyalgia     "got it after my 1st laminectomy;resolved eventually" (09/22/2012)  . Automatic implantable cardioverter-defibrillator in situ      due to non-ischemic CM, Brugada Syndrome  . Complication of anesthesia     unanticipated difficult intubation due to anterior larynx 09/22/12 [facility]); has Brugada Syndrome with recommended avoidance of certain medicaitons  . 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 Posterior cervical laminectomy  1996  . Anterior  cervical decomp/discectomy fusion  2000  . Insert / replace / remove pacemaker  2010  . Cardiac catheterization  07/2008  . Shoulder arthroscopy with debridement and bicep tendon repair Left 09/22/2012  . Dilation and curettage of uterus  1993  . Shoulder arthroscopy with debridement and bicep tendon repair Left 09/22/2012    Procedure: SHOULDER ARTHROSCOPY WITH DEBRIDEMENT AND BICEP TENDON REPAIR;  Surgeon: Cammy Copa, MD;  Location: [facility] OR;  Service: Orthopedics;  Laterality: Left;  Arthroscopy Shoulder with manipulation under anesthesia    Family History  Problem Relation Age of Onset  . Hypertension Mother   . Hypothyroidism Mother    Social History:  reports that she has never smoked. She has never used smokeless tobacco. She reports that she drinks about 2.4 ounces of alcohol per week. She reports that she does not use illicit drugs.  Allergies:  Allergies  Allergen Reactions  . Codeine Other (See Comments)    GI problems  . Diazepam Other (See Comments)    Bladder problems  . Levofloxacin   . Lisinopril Other (See Comments)    Cough and light headed  . Thimerosal Other (See Comments)    Redness   . Keflex [Cephalexin] Rash  . Latex Rash    No prescriptions prior to admission    No results found for this or any previous visit (from the past 48 hour(s)). No results found.  Review of Systems  Constitutional: Negative.  HENT: Negative.   Eyes: Negative.   Respiratory: Negative.   Cardiovascular: Negative.   Gastrointestinal: Negative.   Genitourinary: Negative.   Musculoskeletal: Positive for joint pain.  Skin: Negative.   Neurological: Negative.   Endo/Heme/Allergies: Negative.   Psychiatric/Behavioral: Negative.     There were no vitals taken for this visit. Physical Exam  Constitutional: She appears well-developed.  HENT:  Head: Normocephalic.  Eyes: Pupils are equal, round, and reactive to light.  Neck: Normal range of motion.  Cardiovascular:  Normal rate.   Respiratory: Effort normal.  Neurological: She is alert.  Skin: Skin is warm.  Psychiatric: She has a normal mood and affect.   examination the right shoulder demonstrates restricted extra rotation 15 abduction to about 15 restricted I. sodium oh abduction to about 40 rotator cuff strength is intact no real discrete a.c. joint tenderness to direct palpation  Assessment/Plan Impression is refractory right frozen shoulder with successful left shoulder surgery every year ago. Patient desires surgical correction to increase her range of motion and decrease her pain. Plan is for manipulation with arthroscopy debridement subacromial decompression without acromioplasty and just bursectomy. This will be followed by immediate use of CPM machine. Patient extends the risk and benefits including amounts to infection nerve vessel damage bone breakage. All questions answered.  DEAN,GREGORY SCOTT 09/28/2013, 7:22 AM

## 2013-09-28 NOTE — Transfer of Care (Signed)
Immediate Anesthesia Transfer of Care Note  Patient: Leah Gomez  Procedure(s) Performed: Procedure(s): RIGHT SHOULDER ARTHROSCOPY WITH SUBACROMIAL DECOMPRESSION, MANIPULATION UNDER ANESTHESIA, DEBRIDEMENT  (Right)  Patient Location: PACU  Anesthesia Type:General  Level of Consciousness: oriented, sedated, patient cooperative and responds to stimulation  Airway & Oxygen Therapy: Patient Spontanous Breathing and Patient connected to nasal cannula oxygen  Post-op Assessment: Report given to PACU RN, Post -op Vital signs reviewed and stable, Patient moving all extremities and Patient moving all extremities X 4  Post vital signs: Reviewed and stable  Complications: No apparent anesthesia complications

## 2013-09-28 NOTE — Anesthesia Procedure Notes (Signed)
Procedure Name: Intubation Date/Time: 09/28/2013 11:16 AM Performed by: Wray Kearns A Pre-anesthesia Checklist: Patient identified, Emergency Drugs available, Timeout performed, Suction available and Patient being monitored Patient Re-evaluated:Patient Re-evaluated prior to inductionOxygen Delivery Method: Circle system utilized Preoxygenation: Pre-oxygenation with 100% oxygen Intubation Type: IV induction and Cricoid Pressure applied Ventilation: Mask ventilation without difficulty Laryngoscope Size: Mac Grade View: Grade I Tube type: Oral Tube size: 7.5 mm Number of attempts: 1 Airway Equipment and Method: Stylet and Video-laryngoscopy Placement Confirmation: ETT inserted through vocal cords under direct vision,  breath sounds checked- equal and bilateral and positive ETCO2 Secured at: 22 cm Tube secured with: Tape Dental Injury: Teeth and Oropharynx as per pre-operative assessment  Difficulty Due To: Difficulty was anticipated and Difficult Airway- due to reduced neck mobility

## 2013-09-28 NOTE — Brief Op Note (Signed)
09/28/2013  12:37 PM  PATIENT:  Leah Gomez  63 y.o. female  PRE-OPERATIVE DIAGNOSIS:  RIGHT FROZEN SHOULDER  POST-OPERATIVE DIAGNOSIS:  RIGHT FROZEN SHOULDER  PROCEDURE:  Procedure(s): RIGHT SHOULDER ARTHROSCOPY WITH SUBACROMIAL DECOMPRESSION, MANIPULATION UNDER ANESTHESIA, DEBRIDEMENT   SURGEON:  Surgeon(s): Cammy Copa, MD  ASSISTANT: none  ANESTHESIA:   local  EBL: 10 ml    Total I/O In: 50 [I.V.:50] Out: -   BLOOD ADMINISTERED: none  DRAINS: none   LOCAL MEDICATIONS USED:  none  SPECIMEN:  No Specimen  COUNTS:  YES  TOURNIQUET:  * No tourniquets in log *  DICTATION: .Other Dictation: Dictation Number [id number]  PLAN OF CARE: Admit for overnight observation  PATIENT DISPOSITION:  PACU - hemodynamically stable

## 2013-09-29 NOTE — Evaluation (Signed)
Occupational Therapy Evaluation Patient Details Name: Leah Gomez MRN: [id number] DOB: 10/04/1950 Today's Date: 09/29/2013    History of Present Illness S/P R shoulder manipulation   Clinical Impression   Pt instructed in home exercise program for R UE within pain tolerance.  Pt more successful gaining ROM in supine and with assistance of the countertop, also performed pendulum exercises and shoulder rolls.  Educated pt in compensatory strategies for ADL, but encouraged her to incorporate use of her R UE as she tolerates. No further OT needs. Pt is very happy with the ROM she has already gained, will start OP therapy next week per MD.    Follow Up Recommendations  No OT follow up    Equipment Recommendations       Recommendations for Other Services       Precautions / Restrictions Precautions Precautions: Shoulder Type of Shoulder Precautions: A/PROM R shoulder and HEP as pain tolerates Shoulder Interventions: Shoulder sling/immobilizer Precaution Booklet Issued: Yes (comment) Required Braces or Orthoses: Sling (MD does not want reliance) Restrictions Weight Bearing Restrictions: No      Mobility Bed Mobility Overal bed mobility: Independent                Transfers Overall transfer level: Independent                    Balance Overall balance assessment: Independent                                          ADL Overall ADL's : Modified independent                                       General ADL Comments: instructed pt in compensatory strategies for ADL     Vision                     Perception     Praxis      Pertinent Vitals/Pain Mild pain, premedicated, repositioned, iced     Hand Dominance Right   Extremity/Trunk Assessment Upper Extremity Assessment Upper Extremity Assessment: RUE deficits/detail RUE Deficits / Details: AROM in supine to 110* FF, 100* abduction, 60* ER. full AROM  elbow to hand   Lower Extremity Assessment Lower Extremity Assessment: Overall WFL for tasks assessed       Communication Communication Communication: No difficulties   Cognition Arousal/Alertness: Awake/alert Behavior During Therapy: WFL for tasks assessed/performed Overall Cognitive Status: Within Functional Limits for tasks assessed                     General Comments       Exercises Exercises: Shoulder     Shoulder Instructions Shoulder Instructions Pendulum exercises (written home exercise program): Independent ROM for elbow, wrist and digits of operated UE: Independent Sling wearing schedule (on at all times/off for ADL's): Independent Proper positioning of operated UE when showering: Independent Positioning of UE while sleeping: Independent    Home Living Family/patient expects to be discharged to:: Private residence Living Arrangements: Alone Available Help at Discharge: Family;Available 24 hours/day  Prior Functioning/Environment Level of Independence: Independent             OT Diagnosis:     OT Problem List:     OT Treatment/Interventions:      OT Goals(Current goals can be found in the care plan section) Acute Rehab OT Goals Patient Stated Goal: regain use of R shoulder  OT Frequency:     Barriers to D/C:            Co-evaluation              End of Session Nurse Communication:  (OT complete, ready for discharge)  Activity Tolerance: Patient tolerated treatment well Patient left: in chair;with call bell/phone within reach;with family/visitor present   Time: [id number] OT Time Calculation (min): 50 min Charges:  OT General Charges $OT Visit: 1 Procedure OT Evaluation $Initial OT Evaluation Tier I: 1 Procedure OT Treatments $Self Care/Home Management : 8-22 mins $Therapeutic Exercise: 23-37 mins G-Codes: OT G-codes **NOT FOR INPATIENT CLASS** Functional Assessment Tool  Used: clinical observation Functional Limitation: Self care Self Care Current Status ([id number]): At least 1 percent but less than 20 percent impaired, limited or restricted Self Care Goal Status ([id number]([id number]): At least 1 percent but less than 20 percent impaired, limited or restricted Self Care Discharge Status [phone]([id number]): At least 1 percent but less than 20 percent impaired, limited or restricted  Leah Gomez, Leah Gomez 09/29/2013, 11:41 AM [phone]

## 2013-09-29 NOTE — Progress Notes (Signed)
Patient provided with discharge instructions and follow up information. She is going home at this time with support from her daughter. Pain medication given and regimen explained. NO further questions at this time. Pt is going home in private vehicle at this time

## 2013-09-29 NOTE — Progress Notes (Signed)
UR Completed.  Lecretia Buczek Jane [phone] 09/29/2013  

## 2013-09-29 NOTE — Progress Notes (Signed)
Subjective: Pt stable - shoulder painful but manageable   Objective: Vital signs in last 24 hours: Temp:  [97.2 F (36.2 C)-98.7 F (37.1 C)] 97.9 F (36.6 C) (07/01 0521) Pulse Rate:  [59-85] 60 (07/01 0521) Resp:  [9-24] 18 (07/01 0521) BP: (98-122)/(36-69) 108/49 mmHg (07/01 0521) SpO2:  [96 %-100 %] 100 % (07/01 0521) Weight:  [58.684 kg (129 lb 6 oz)] 58.684 kg (129 lb 6 oz) (06/30 0922)  Intake/Output from previous day: 06/30 0701 - 07/01 0700 In: 1640 [P.O.:840; I.V.:800] Out: 75 [Blood:75] Intake/Output this shift: Total I/O In: 490 [P.O.:240; I.V.:250] Out: -   Exam:  Compartment soft  Labs: No results found for this basename: HGB,  in the last 72 hours No results found for this basename: WBC, RBC, HCT, PLT,  in the last 72 hours No results found for this basename: NA, K, CL, CO2, BUN, CREATININE, GLUCOSE, CALCIUM,  in the last 72 hours No results found for this basename: LABPT, INR,  in the last 72 hours  Assessment/Plan: Plan dc today after PT OT for active and passive rom and HEP   DEAN,GREGORY SCOTT 09/29/2013, 6:49 AM

## 2013-09-29 NOTE — Op Note (Signed)
NAME:  Leah Gomez, REIF NO.:  [id number]  MEDICAL RECORD NO.:  [id number]  LOCATION:  5N02C                        FACILITY:  [facility]  PHYSICIAN:  Burnard Bunting, M.D.    DATE OF BIRTH:  [birth year]  DATE OF PROCEDURE: DATE OF DISCHARGE:                              OPERATIVE REPORT   PREOPERATIVE DIAGNOSIS:  Right frozen shoulder.  POSTOPERATIVE DIAGNOSIS:  Right frozen shoulder.  PROCEDURES:  Right shoulder manipulation under anesthesia with release of rotator interval and subacromial bursectomy.  SURGEON:  Burnard Bunting, M.D.  ASSISTANT:  None.  ANESTHESIA:  General.  INDICATIONS:  [name] is a patient with right frozen shoulder of long duration, presents for operative management after explanation of risks and benefits.  PROCEDURE IN DETAIL:  The patient was brought to the operating room where general anesthetic was induced.  Time-out was called.  Right shoulder was examined under anesthesia, found to have 15 degrees of external rotation, 15 degrees of abduction, about 40 degrees of isolated glenohumeral abduction and about 60 degrees of isolated glenohumeral forward flexion.  Using the small fulcrum possible, the arm was manipulated with crepitus of tissue into full forward flexion and more than 90 degrees of abduction.  External rotation improved to about 30 degrees after these maneuvers.  The patient was then placed in beach- chair position.  Head in neutral position.  Right shoulder, arm and wrist were prescribed with alcohol and Betadine, allowed the air to dry, prepped with DuraPrep solution and draped in a sterile manner. Posterior portal was created.  Anterior portal was created under direct visualization.  Diagnostic arthroscopy was performed.  Rotator cuff was intact.  There was some fraying of the labrum particularly anteriorly. This was debrided with the shaver.  Rotator interval was thickened and it was released with the ArthroCare wand.  This  allowed about 70 degrees of external rotation.  The scope was then placed into the subacromial space where bursectomy was performed with creation of an additional lateral portal.  The patient achieved excellent range of motion and had release of a rotator interval and debridement of the bursa without CA ligament release and without any bone morphology from the acromion. This was essentially the same procedure she had done on her left shoulder with good success.  Instruments were removed.  Portals were closed using 3-0 nylon.  The anterior portal was closed using 3-0 Vicryl and 3-0 nylon.  Solution of ropivacaine, clonidine and morphine were injected in a divided fashion in the subacromial space and glenohumeral joint.  The patient tolerated the procedure well without immediate complications.  She was transferred to the recovery room in a sling, we will start her with occupational therapy today.     Burnard Bunting, M.D.     GSD/MEDQ  D:  09/28/2013  T:  09/29/2013  Job:  [id number]

## 2014-11-27 IMAGING — CR DG CHEST 2V
2 series · 2 of 2 positions shown · non-contrast
Comparison: None.

CLINICAL DATA: Shoulder arthroscopy with debridement and biceps
tendon repair.  Cardiomyopathy and history of CHF, mitral
regurgitation.

CHEST - 2 VIEW

[view not recorded (1 of 2)]
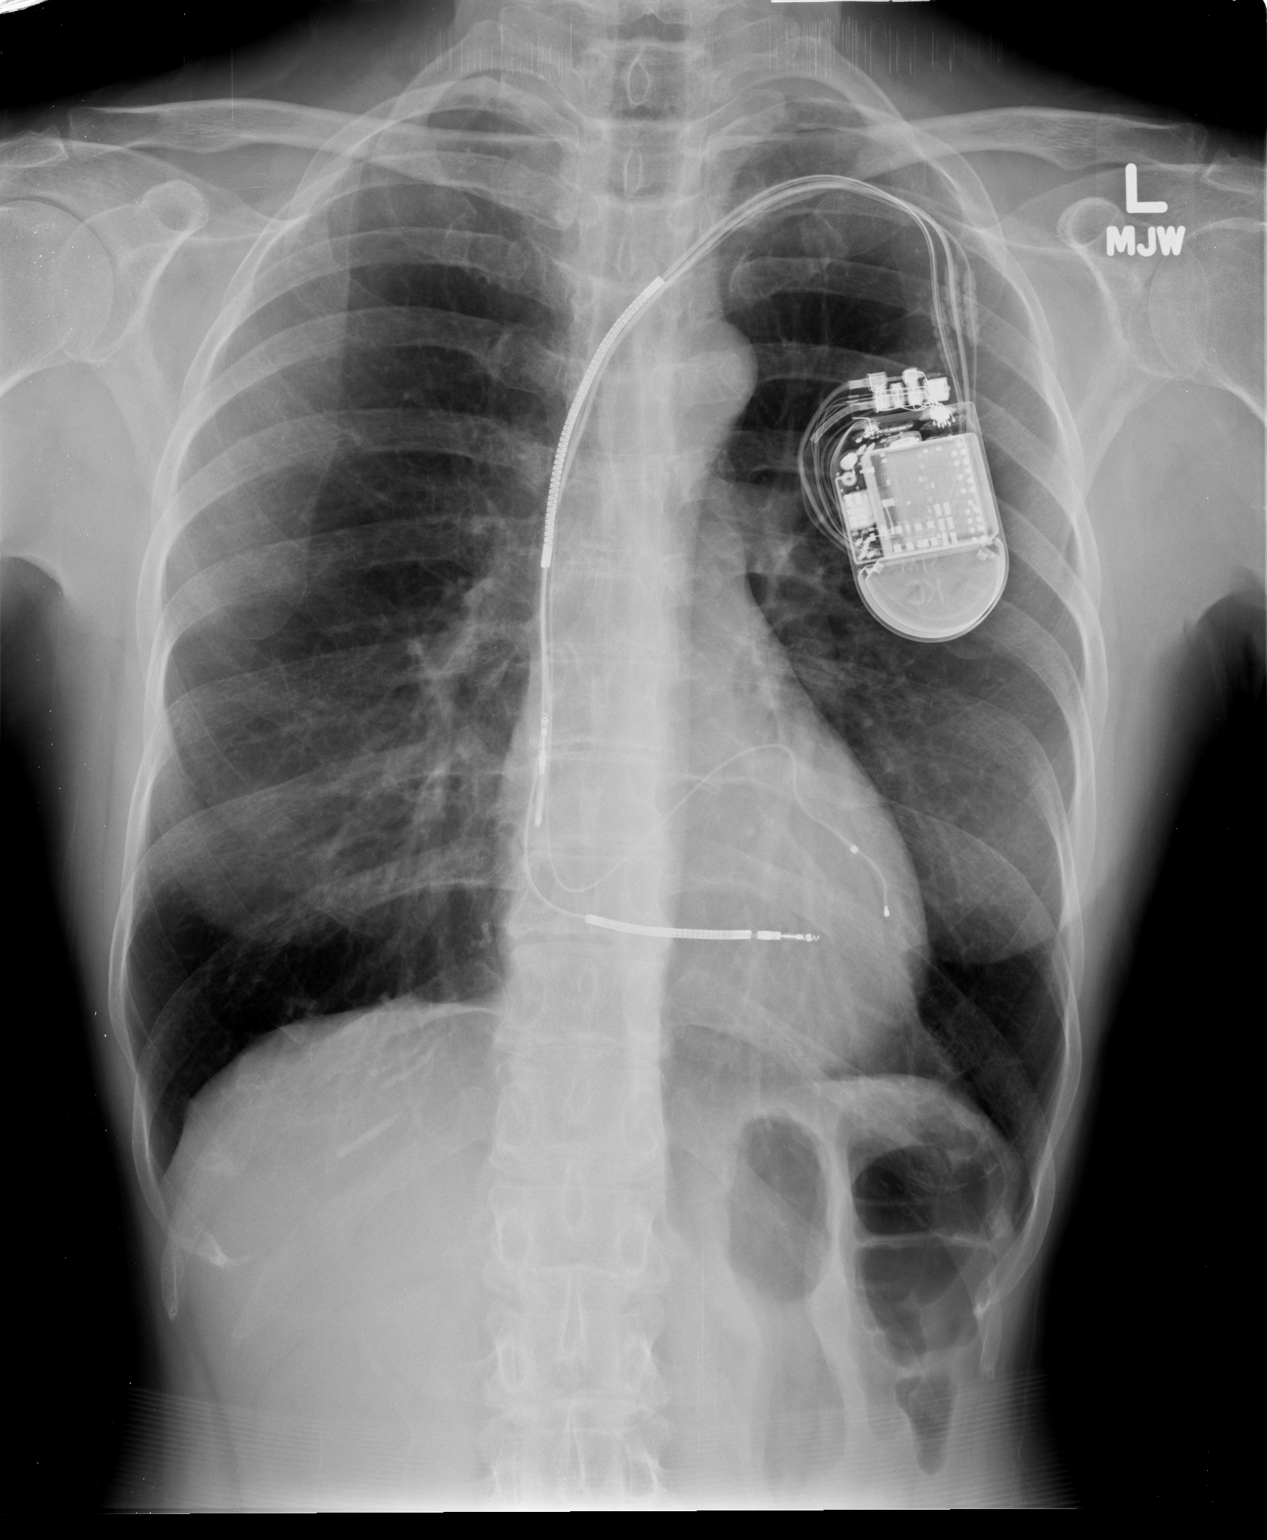

[view not recorded (2 of 2)]
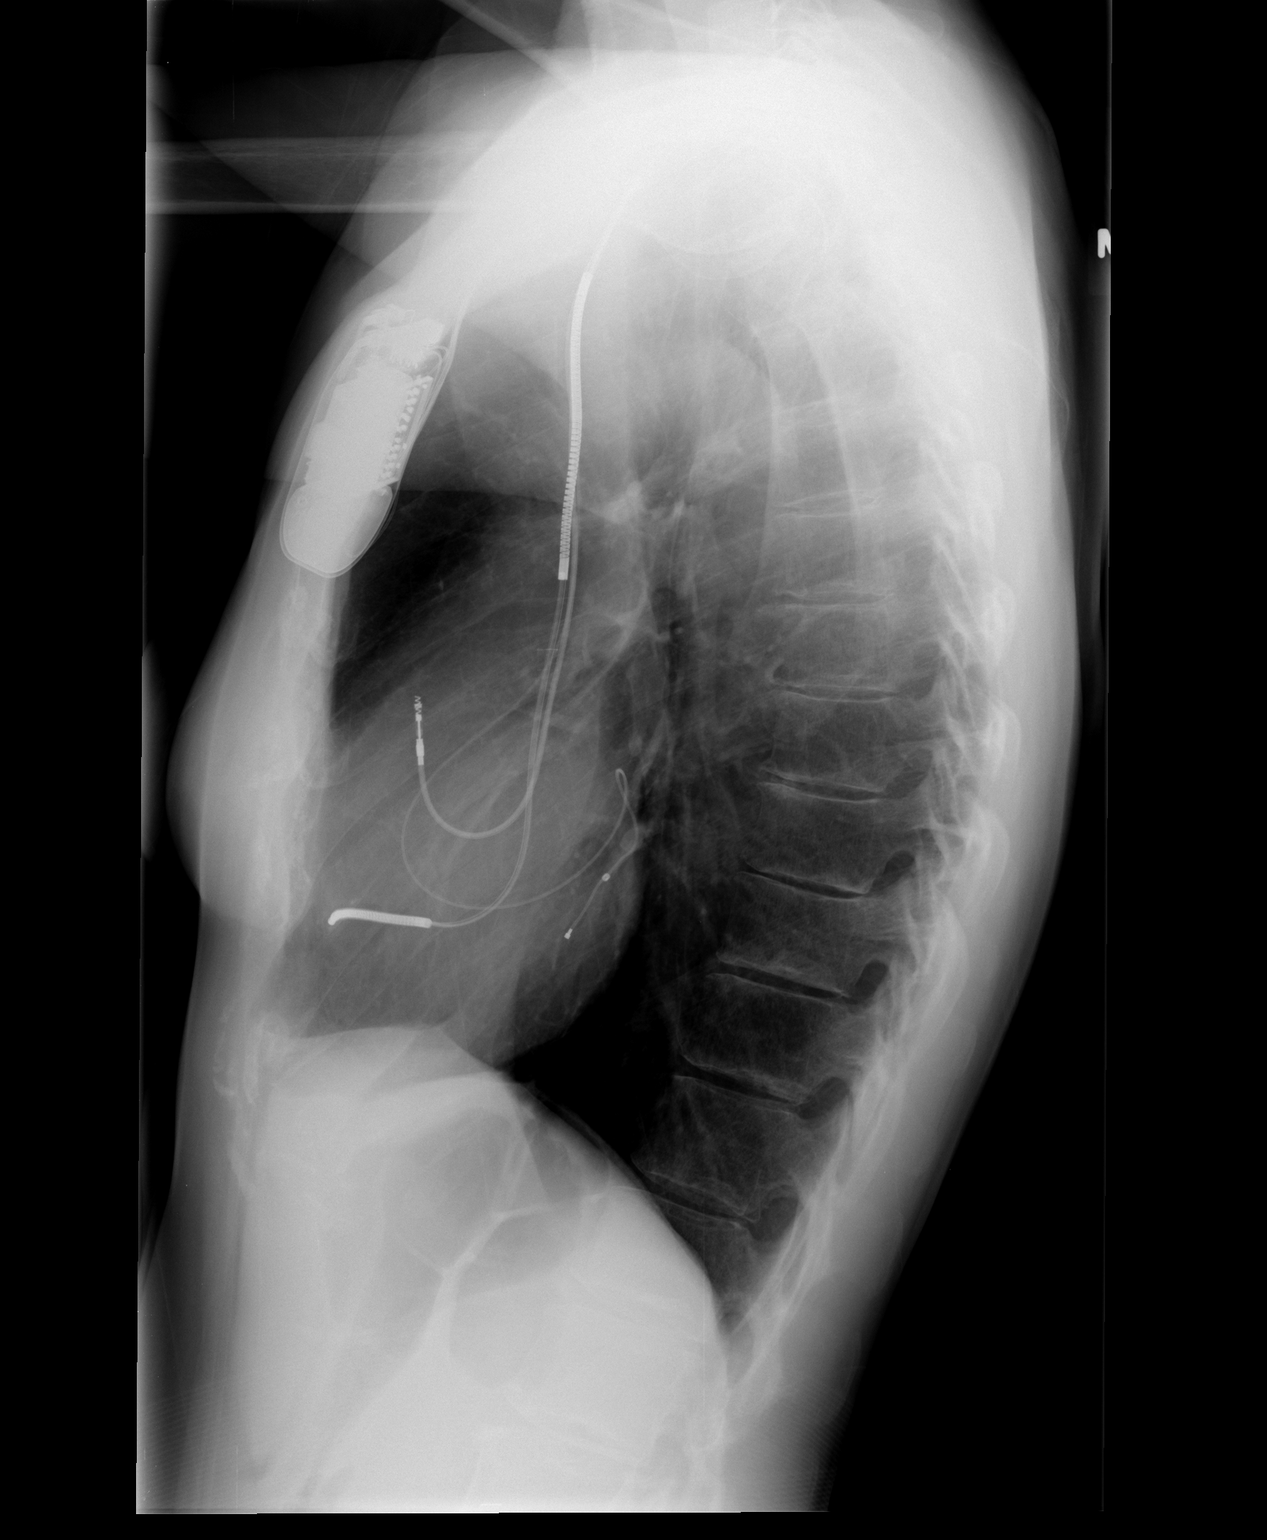

[2 of 2 positions shown; findings below may reference images not displayed]

FINDINGS: The patient has left-sided AICD / transvenous pacemaker.
Leads overlie the right atrium, right ventricle, and coronary
sinus.  Heart size is normal.  The lungs are clear.  No edema.
Visualized osseous structures have a normal appearance.
IMPRESSION: No evidence for acute cardiopulmonary abnormality.
